# Patient Record
Sex: Female | Born: 1985 | Hispanic: No | Marital: Married | Smoking: Never smoker
Health system: Southern US, Community
[De-identification: ages and names within clinical notes are randomized; demographics above are authoritative.]

## PROBLEM LIST (undated history)

## (undated) ENCOUNTER — Inpatient Hospital Stay (HOSPITAL_COMMUNITY): Payer: Self-pay

## (undated) DIAGNOSIS — Z3480 Encounter for supervision of other normal pregnancy, unspecified trimester: Secondary | ICD-10-CM

## (undated) DIAGNOSIS — Z789 Other specified health status: Secondary | ICD-10-CM

## (undated) HISTORY — DX: Encounter for supervision of other normal pregnancy, unspecified trimester: Z34.80

---

## 2015-12-05 ENCOUNTER — Other Ambulatory Visit (HOSPITAL_COMMUNITY)
Admission: RE | Admit: 2015-12-05 | Discharge: 2015-12-05 | Disposition: A | Discharge status: Home / Self Care | Payer: BLUE CROSS/BLUE SHIELD | Source: Ambulatory Visit | Attending: Obstetrics and Gynecology | Admitting: Obstetrics and Gynecology

## 2015-12-05 DIAGNOSIS — Z1151 Encounter for screening for human papillomavirus (HPV): Secondary | ICD-10-CM | POA: Diagnosis present

## 2015-12-05 DIAGNOSIS — Z01419 Encounter for gynecological examination (general) (routine) without abnormal findings: Secondary | ICD-10-CM | POA: Insufficient documentation

## 2015-12-16 ENCOUNTER — Ambulatory Visit (HOSPITAL_COMMUNITY)
Admission: RE | Admit: 2015-12-16 | Discharge: 2015-12-16 | Disposition: A | Discharge status: Home / Self Care | Payer: BLUE CROSS/BLUE SHIELD | Source: Ambulatory Visit | Attending: Obstetrics and Gynecology | Admitting: Obstetrics and Gynecology

## 2015-12-16 DIAGNOSIS — O352XX0 Maternal care for (suspected) hereditary disease in fetus, not applicable or unspecified: Secondary | ICD-10-CM

## 2015-12-18 ENCOUNTER — Encounter (HOSPITAL_COMMUNITY): Payer: Self-pay

## 2015-12-18 DIAGNOSIS — O352XX0 Maternal care for (suspected) hereditary disease in fetus, not applicable or unspecified: Secondary | ICD-10-CM | POA: Insufficient documentation

## 2015-12-18 HISTORY — DX: Maternal care for (suspected) hereditary disease in fetus, not applicable or unspecified: O35.2XX0

## 2015-12-18 NOTE — Progress Notes (Signed)
Genetic Counseling  High-Risk Gestation Note  Appointment Date:  12/16/2015 Referred By: Thurnell Lose, MD Date of Birth:  11-12-1985 Partner:  Sara Pineda   Pregnancy History: G1P0 Estimated Date of Delivery: 06/28/16 Estimated Gestational Age: 99w1dAttending: MRenella Cunas MD   I met with Mrs. Sara Pineda and her husband, Mr. Sara Pineda for genetic counseling because of Mr. BReznikfamily history of a genetic disorder.  In Summary:   Father of the pregnancy, Mr. 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 BCasstevenshas a 2 in 3 chance to be a carrier. Mrs. BRabelohas general population chance which ranges by subtype but overall less than 1 in 100.   For an autosomal recessive MPS, recurrence risk for pregnancy would be approximately less than 1 in 600, prior to testing for patient or partner  One form of MPS follows X-linked inheritance, which is less likely given the reported family history  In case of X-linked inheritance, pregnancy would not be at increased risk given that Mr. BSuppais apparently unaffected  Genetic testing to determine carrier status may be available; However, given the multiple types of MPS and multiple genes that can be causative, most informative to identify the specific type and if genetic testing has been performed and identified the causative gene change in Mr. BMongillo brother  Carrier screening for various types (but not all) of MPS is available for common mutations in some genes through expanded carrier screening; However, detection rates range from 10%-75%. Thus, a negative expanded carrier screening panel for Mr. BGlabor Mrs. Saran would possibly reduce the chances to be carriers for these conditions but not eliminate the chance.   In the case that both parents are identified to be carriers for an autosomal recessive condition, prenatal diagnosis may be available via amniocentesis. The couple stated that they would not consider pursuing amniocentesis in a pregnancy, even in the case carrier status was identified for them.   Mr. BPrestridgeplanned to inquire with one of his brothers who is directly involved [place] obtain more specific information about the diagnosis and the possible genetic etiology. He will contact [place] additional information is obtained  We began by reviewing the family history in detail. Mr. BMoldenreported two brothers with a genetic disorder, [place] He did not have the exact name of the condition but had a message from an unaffected brother regarding the medical condition that stated, "Mucopolysaccharidose recherche des defauts de l'enzyme." (This translates to "mucopolysaccharidosis, search for the defects in the enzyme.") One affected brother died at age 5711years old, and the other affected brother is currently 261years old and [place] Mr. BRajreported that both individuals had no medical issues at birth. The condition was described to have physical effects but no effect on their intellect or cognitive ability. The older affected brother reportedly has onset around 864years of age, and his bones were described to start  to become deformed. He reportedly died as a result of a nerve problem in his brain; he was described to have woken up  and been unable to walk or talk and then died 2 days later. Mr. Redler had more medical information regarding the surviving affected brother. Medical documentation that Mr. Ahn had access to described him to have deformity of bones in feet at age 74 years, dorsal kyphoscoliosis, upper limb fractures at age 71 years old, and a pelvis fracture at age 6 years causing him to be "bed ridden." He is currently in a wheelchair. He reportedly does not have cognitive effects from the condition.  He was also recently found to have a hole in his heart and have two tumors on his liver. Mr. Brocks reported that his brother has been evaluated by doctors in [place], and a doctor told them that the "DNA was not complete" and it was caused by their parents being related to each other. Mr. Coakley reported that his mother and father are paternal first cousins to each other. Mr. Marsan also reported that there is treatment for his brother's condition in [place] and in the [place] but not in [place]. He is currently treated by a [facility] in [place], but they have not been able to get approval for his travel to [place] for the other treatment available. Mr. Leatherwood also has one additional brother and four sisters who are reportedly unaffected. Three of the sisters have children, ranging in age from 3 years to upper adolescence (two children each, with a total of three males and three females) who are all reportedly healthy.  Mrs. Sara Pineda reported no known relatives with a similar medical issue and no known birth defects, genetic conditions, or intellectual disability in her family history. Mrs. Sara Pineda is also from [place]; the patient and her partner denied consanguinity.   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 We discussed mucopolysaccharidoses in general.   Mucopolysaccharidoses are a group of inherited lysosomal storage diseases that are caused by deficiency of one of several enzymes required for step-wise degradation of glycosaminoglycans (GAGs), also called mucopolysaccharides. The remaining GAGs accumulate in the lysosomes in various cell tissues and lead to cellular dysfunction. MPS disorders are classified into seven different types, with possible subtypes within the classifications. There is variation in which body systems are impacted in the various types with some causing soft tissue and skeletal disease +/- brain disease (MPS I, II, VII), soft tissue and skeletal disease (MPS VI), primarily skeletal disease (MPS IVA, IVB), and primarily central nervous system disorders (MPS III). There is also a broad spectrum of severity of clinical features ranging from mild to severe. In general, infants typically appear normal at birth, with symptoms of the various MPS being progressive as the GAGs accumulate with time. Enzyme replacement therapy is available for some of the various types of MPS conditions.   Mr. Heyden did not have additional information regarding the specific type of MPS, but his report of primarily skeletal involvement without cognitive effects helps narrow the differential diagnosis list. We reviewed that the majority of MPS conditions follow autosomal recessive inheritance, with the exception of Hunter syndrome (MPS II), which follows X-linked inheritance. Given this and the report that his parents are first cousins, autosomal recessive inheritance is most likely for the condition for his brothers. We reviewed genes, chromosomes, and autosomal recessive inheritance. We discussed that  an individual with autosomal recessive inheritance has two nonworking copies of a particular gene. Carriers have one working and one nonworking copy of the gene pair. A pregnancy is at risk to inherit an autosomal recessive condition when both parents are carriers  for the same nonworking gene.   In the case of autosomal recessive inheritance for these relatives, Mr. Arcand would have a 2 in 3 chance to be a carrier, given that he is apparently unaffected. Mrs. Bluett reported no known family history of similar conditions, and she has no known consanguinity to Mr. Rochette. Thus, she would have the general population risk to be a carrier, which ranges by specific type of MPS, but overall would be estimated to be less than 1 in 100 (with some types being much less common than this estimate). Thus, prior to carrier screening for the couple, recurrence risk for the current pregnancy in the case of an autosomal recessive MPS would be approximately less than 1 in 600. The exact general population carrier frequency, and thus exact estimated recurrence risk, varies by MPS type.   We discussed that less likely case of an X-linked recessive condition. Hunter syndrome is an MPS condition that follows X-linked inheritance. In X-linked inheritance, females may carry a nonworking gene change on the X chromosome and have an increased risk to have an affected son. Males with the nonworking gene change would be expected to be affected with the condition. Thus, in the case of an X-linked recessive condition, the pregnancy would not be at increased risk, given that Mr. Breeding is apparently unaffected.   Clinical genetic testing is available for various genes that are causative for MPS. However, each type has different gene(s) that can be causative of the condition. Thus, knowing the specific type of MPS in Mr. Soucy brother (or specific genetic condition, in the case of a different underlying etiology) is needed prior to pursuing genetic testing. Additionally, carrier testing for Mr. Krasinski would be most informative if the familial causative genetic variant(s) has first been identified in his brothers. Carrier screening for some MPS types is available via expanded  carrier screening panels, but these assess for common mutations. Thus, the detection rate ranges from approximately 10% to 75% depending upon the specific MPS type and the specific prevalence. Carrier screening for Mr. or Mrs. Brougham via an expanded carrier screening panel is available, but would not be as informative prior to first identifying the above discussed information. A negative expanded carrier screening panel would decrease but not eliminate the chance to be a carrier for the conditions on the panel, and not all types of MPS are included on these carrier screening panels. Mr. Zito planned to communicate with his brother who is in [place] and directly involved with his affected brother's medical care in order to try to obtain information about the type of MPS and whether or not genetic testing has been performed.   When both parents are identified to be carriers of an autosomal recessive condition, prenatal diagnosis would be available via amniocentesis. We reviewed the risks, benefits, and limitations including the associated risk for complications from amniocentesis. The couple understands that amniocentesis for the genetic condition in the family would not be available in the pregnancy prior to carrier testing/screening for them; However, they stated that they would not pursue amniocentesis even in the case that they were both identified to be carriers for the same genetic condition.    The family histories were otherwise found to be noncontributory for  birth defects, intellectual disability, and known genetic conditions. Without further information regarding the provided family history, an accurate genetic risk cannot be calculated. Further genetic counseling is warranted if more information is obtained.  Mrs. Sara Pineda denied exposure to environmental toxins or chemical agents. She denied the use of alcohol, tobacco or street drugs. She denied significant viral illnesses during the  course of her pregnancy. Her medical and surgical histories were noncontributory.   I counseled this couple regarding the above risks and available options.  The approximate face-to-face time with the genetic counselor was 45 minutes.  Chipper Oman, MS Certified Genetic Counselor 12/18/2015

## 2016-01-02 ENCOUNTER — Other Ambulatory Visit: Payer: Self-pay | Admitting: Obstetrics and Gynecology

## 2016-01-02 DIAGNOSIS — IMO0002 Reserved for concepts with insufficient information to code with codable children: Secondary | ICD-10-CM

## 2016-01-05 ENCOUNTER — Ambulatory Visit (HOSPITAL_COMMUNITY)
Admission: RE | Admit: 2016-01-05 | Discharge: 2016-01-05 | Disposition: A | Discharge status: Home / Self Care | Payer: BLUE CROSS/BLUE SHIELD | Source: Ambulatory Visit | Attending: Obstetrics and Gynecology | Admitting: Obstetrics and Gynecology

## 2016-01-05 ENCOUNTER — Encounter (HOSPITAL_COMMUNITY): Payer: Self-pay | Admitting: *Deleted

## 2016-01-05 DIAGNOSIS — Z3A15 15 weeks gestation of pregnancy: Secondary | ICD-10-CM | POA: Insufficient documentation

## 2016-01-05 DIAGNOSIS — IMO0002 Reserved for concepts with insufficient information to code with codable children: Secondary | ICD-10-CM

## 2016-01-06 ENCOUNTER — Encounter (HOSPITAL_COMMUNITY)
Admission: RE | Disposition: A | Discharge status: Home / Self Care | Payer: Self-pay | Source: Ambulatory Visit | Attending: Obstetrics and Gynecology

## 2016-01-06 ENCOUNTER — Ambulatory Visit (HOSPITAL_COMMUNITY): Payer: BLUE CROSS/BLUE SHIELD | Admitting: Anesthesiology

## 2016-01-06 ENCOUNTER — Ambulatory Visit (HOSPITAL_COMMUNITY)
Admission: RE | Admit: 2016-01-06 | Discharge: 2016-01-06 | Disposition: A | Discharge status: Home / Self Care | Payer: BLUE CROSS/BLUE SHIELD | Source: Ambulatory Visit | Attending: Obstetrics and Gynecology | Admitting: Obstetrics and Gynecology

## 2016-01-06 ENCOUNTER — Ambulatory Visit (HOSPITAL_COMMUNITY): Payer: BLUE CROSS/BLUE SHIELD

## 2016-01-06 ENCOUNTER — Encounter (HOSPITAL_COMMUNITY): Payer: Self-pay | Admitting: Anesthesiology

## 2016-01-06 DIAGNOSIS — Q513 Bicornate uterus: Secondary | ICD-10-CM | POA: Diagnosis not present

## 2016-01-06 DIAGNOSIS — Z3A10 10 weeks gestation of pregnancy: Secondary | ICD-10-CM | POA: Insufficient documentation

## 2016-01-06 DIAGNOSIS — O021 Missed abortion: Secondary | ICD-10-CM | POA: Insufficient documentation

## 2016-01-06 DIAGNOSIS — O029 Abnormal product of conception, unspecified: Secondary | ICD-10-CM

## 2016-01-06 HISTORY — PX: DILATION AND EVACUATION: SHX1459

## 2016-01-06 LAB — CBC
HCT: 35.6 % — ABNORMAL LOW (ref 36.0–46.0)
Hemoglobin: 12.3 g/dL (ref 12.0–15.0)
MCH: 31.1 pg (ref 26.0–34.0)
MCHC: 34.6 g/dL (ref 30.0–36.0)
MCV: 89.9 fL (ref 78.0–100.0)
PLATELETS: 241 10*3/uL (ref 150–400)
RBC: 3.96 MIL/uL (ref 3.87–5.11)
RDW: 12.6 % (ref 11.5–15.5)
WBC: 8.6 10*3/uL (ref 4.0–10.5)

## 2016-01-06 LAB — TYPE AND SCREEN
ABO/RH(D): A POS
ANTIBODY SCREEN: NEGATIVE

## 2016-01-06 LAB — ABO/RH: ABO/RH(D): A POS

## 2016-01-06 SURGERY — DILATION AND EVACUATION, UTERUS
Anesthesia: General | Site: Vagina

## 2016-01-06 MED ORDER — IBUPROFEN 100 MG/5ML PO SUSP: 200.0000 mg | Freq: Four times a day (QID) | ORAL | Status: DC | PRN

## 2016-01-06 MED ORDER — PROPOFOL 10 MG/ML IV BOLUS
INTRAVENOUS | Status: DC | PRN
  Administered 2016-01-06: 50 mg via INTRAVENOUS
  Administered 2016-01-06: 150 mg via INTRAVENOUS

## 2016-01-06 MED ORDER — DEXAMETHASONE SODIUM PHOSPHATE 10 MG/ML IJ SOLN
INTRAMUSCULAR | Status: AC
  Filled 2016-01-06: qty 1

## 2016-01-06 MED ORDER — MISOPROSTOL 100 MCG PO TABS
ORAL_TABLET | ORAL | Status: DC | PRN
  Administered 2016-01-06: 200 ug via RECTAL

## 2016-01-06 MED ORDER — LIDOCAINE HCL (CARDIAC) 20 MG/ML IV SOLN
INTRAVENOUS | Status: AC
  Filled 2016-01-06: qty 5

## 2016-01-06 MED ORDER — ONDANSETRON HCL 4 MG/2ML IJ SOLN
INTRAMUSCULAR | Status: AC
  Filled 2016-01-06: qty 2

## 2016-01-06 MED ORDER — PROPOFOL 10 MG/ML IV BOLUS
INTRAVENOUS | Status: AC
  Filled 2016-01-06: qty 20

## 2016-01-06 MED ORDER — METHYLERGONOVINE MALEATE 0.2 MG PO TABS
0.2000 mg | ORAL_TABLET | Freq: Once | ORAL | Status: AC
  Administered 2016-01-06: 0.2 mg via ORAL

## 2016-01-06 MED ORDER — SCOPOLAMINE 1 MG/3DAYS TD PT72
1.0000 | MEDICATED_PATCH | Freq: Once | TRANSDERMAL | Status: DC
  Administered 2016-01-06: 1.5 mg via TRANSDERMAL

## 2016-01-06 MED ORDER — SUCCINYLCHOLINE CHLORIDE 20 MG/ML IJ SOLN
INTRAMUSCULAR | Status: AC
  Filled 2016-01-06: qty 1

## 2016-01-06 MED ORDER — GLYCOPYRROLATE 0.2 MG/ML IJ SOLN
INTRAMUSCULAR | Status: DC | PRN
  Administered 2016-01-06: 0.2 mg via INTRAVENOUS

## 2016-01-06 MED ORDER — HYDROCODONE-ACETAMINOPHEN 7.5-325 MG PO TABS: 1.0000 | ORAL_TABLET | Freq: Once | ORAL | Status: DC | PRN

## 2016-01-06 MED ORDER — MEPERIDINE HCL 25 MG/ML IJ SOLN: 6.2500 mg | INTRAMUSCULAR | Status: DC | PRN

## 2016-01-06 MED ORDER — METHYLERGONOVINE MALEATE 0.2 MG PO TABS: 0.2000 mg | ORAL_TABLET | Freq: Four times a day (QID) | ORAL | Status: DC

## 2016-01-06 MED ORDER — FENTANYL CITRATE (PF) 100 MCG/2ML IJ SOLN
INTRAMUSCULAR | Status: DC | PRN
  Administered 2016-01-06: 50 ug via INTRAVENOUS
  Administered 2016-01-06: 100 ug via INTRAVENOUS

## 2016-01-06 MED ORDER — MIDAZOLAM HCL 2 MG/2ML IJ SOLN
INTRAMUSCULAR | Status: DC | PRN
  Administered 2016-01-06: 2 mg via INTRAVENOUS

## 2016-01-06 MED ORDER — IBUPROFEN 600 MG PO TABS: 600.0000 mg | ORAL_TABLET | Freq: Four times a day (QID) | ORAL | Status: DC | PRN

## 2016-01-06 MED ORDER — DEXAMETHASONE SODIUM PHOSPHATE 4 MG/ML IJ SOLN
INTRAMUSCULAR | Status: DC | PRN
  Administered 2016-01-06: 4 mg via INTRAVENOUS

## 2016-01-06 MED ORDER — ONDANSETRON HCL 4 MG/2ML IJ SOLN: 4.0000 mg | Freq: Once | INTRAMUSCULAR | Status: DC | PRN

## 2016-01-06 MED ORDER — KETOROLAC TROMETHAMINE 30 MG/ML IJ SOLN
INTRAMUSCULAR | Status: DC | PRN
  Administered 2016-01-06: 30 mg via INTRAVENOUS

## 2016-01-06 MED ORDER — FENTANYL CITRATE (PF) 100 MCG/2ML IJ SOLN: 25.0000 ug | INTRAMUSCULAR | Status: DC | PRN

## 2016-01-06 MED ORDER — KETOROLAC TROMETHAMINE 30 MG/ML IJ SOLN: 30.0000 mg | Freq: Once | INTRAMUSCULAR | Status: DC

## 2016-01-06 MED ORDER — NEOSTIGMINE METHYLSULFATE 10 MG/10ML IV SOLN
INTRAVENOUS | Status: AC
  Filled 2016-01-06: qty 1

## 2016-01-06 MED ORDER — IBUPROFEN 200 MG PO TABS: 200.0000 mg | ORAL_TABLET | Freq: Four times a day (QID) | ORAL | Status: DC | PRN

## 2016-01-06 MED ORDER — CEFAZOLIN SODIUM-DEXTROSE 2-3 GM-% IV SOLR
INTRAVENOUS | Status: AC
Start: 2016-01-06 — End: 2016-01-06
  Administered 2016-01-06: 2 g via INTRAVENOUS
  Filled 2016-01-06: qty 50

## 2016-01-06 MED ORDER — ONDANSETRON HCL 4 MG/2ML IJ SOLN
INTRAMUSCULAR | Status: DC | PRN
  Administered 2016-01-06: 4 mg via INTRAVENOUS

## 2016-01-06 MED ORDER — LIDOCAINE HCL 2 % IJ SOLN
INTRAMUSCULAR | Status: DC | PRN
  Administered 2016-01-06: 7 mL

## 2016-01-06 MED ORDER — CEFAZOLIN SODIUM-DEXTROSE 2-4 GM/100ML-% IV SOLN
2.0000 g | INTRAVENOUS | Status: DC
  Filled 2016-01-06: qty 100

## 2016-01-06 MED ORDER — METHYLERGONOVINE MALEATE 0.2 MG PO TABS
ORAL_TABLET | ORAL | Status: AC
  Administered 2016-01-06: 0.2 mg via ORAL
  Filled 2016-01-06: qty 1

## 2016-01-06 MED ORDER — LIDOCAINE HCL 2 % IJ SOLN
INTRAMUSCULAR | Status: AC
  Filled 2016-01-06: qty 20

## 2016-01-06 MED ORDER — LACTATED RINGERS IV SOLN
INTRAVENOUS | Status: DC
Start: 2016-01-06 — End: 2016-01-06
  Administered 2016-01-06 (×2): 1000 mL via INTRAVENOUS

## 2016-01-06 MED ORDER — SCOPOLAMINE 1 MG/3DAYS TD PT72
MEDICATED_PATCH | TRANSDERMAL | Status: AC
  Filled 2016-01-06: qty 1

## 2016-01-06 MED ORDER — LIDOCAINE HCL (CARDIAC) 20 MG/ML IV SOLN
INTRAVENOUS | Status: DC | PRN
  Administered 2016-01-06: 50 mg via INTRAVENOUS

## 2016-01-06 MED ORDER — SUCCINYLCHOLINE CHLORIDE 20 MG/ML IJ SOLN
INTRAMUSCULAR | Status: DC | PRN
  Administered 2016-01-06: 120 mg via INTRAVENOUS

## 2016-01-06 MED ORDER — ROCURONIUM BROMIDE 100 MG/10ML IV SOLN
INTRAVENOUS | Status: AC
  Filled 2016-01-06: qty 1

## 2016-01-06 MED ORDER — FENTANYL CITRATE (PF) 250 MCG/5ML IJ SOLN
INTRAMUSCULAR | Status: AC
Start: 2016-01-06 — End: 2016-01-06
  Filled 2016-01-06: qty 5

## 2016-01-06 MED ORDER — MIDAZOLAM HCL 2 MG/2ML IJ SOLN
INTRAMUSCULAR | Status: AC
  Filled 2016-01-06: qty 2

## 2016-01-06 MED ORDER — MISOPROSTOL 200 MCG PO TABS
ORAL_TABLET | ORAL | Status: AC
  Filled 2016-01-06: qty 1

## 2016-01-06 SURGICAL SUPPLY — 18 items
CATH ROBINSON RED A/P 16FR (CATHETERS) ×3 IMPLANT
CLOTH BEACON ORANGE TIMEOUT ST (SAFETY) ×3 IMPLANT
DECANTER SPIKE VIAL GLASS SM (MISCELLANEOUS) ×3 IMPLANT
GLOVE BIO SURGEON STRL SZ7 (GLOVE) ×6 IMPLANT
GLOVE BIOGEL PI IND STRL 7.0 (GLOVE) ×1 IMPLANT
GLOVE BIOGEL PI INDICATOR 7.0 (GLOVE) ×2
GOWN STRL REUS W/TWL LRG LVL3 (GOWN DISPOSABLE) ×6 IMPLANT
KIT BERKELEY 1ST TRIMESTER 3/8 (MISCELLANEOUS) ×3 IMPLANT
NS IRRIG 1000ML POUR BTL (IV SOLUTION) ×3 IMPLANT
PACK VAGINAL MINOR WOMEN LF (CUSTOM PROCEDURE TRAY) ×3 IMPLANT
PAD OB MATERNITY 4.3X12.25 (PERSONAL CARE ITEMS) ×3 IMPLANT
PAD PREP 24X48 CUFFED NSTRL (MISCELLANEOUS) ×3 IMPLANT
SET BERKELEY SUCTION TUBING (SUCTIONS) ×3 IMPLANT
TOWEL OR 17X24 6PK STRL BLUE (TOWEL DISPOSABLE) ×6 IMPLANT
VACURETTE 10 RIGID CVD (CANNULA) ×3 IMPLANT
VACURETTE 7MM CVD STRL WRAP (CANNULA) IMPLANT
VACURETTE 8 RIGID CVD (CANNULA) IMPLANT
VACURETTE 9 RIGID CVD (CANNULA) IMPLANT

## 2016-01-06 NOTE — Interval H&P Note (Signed)
History and Physical Interval Note:  01/06/2016 2:30 PM  Sara Pineda  has presented today for surgery, with the diagnosis of  Missed Abortion Biconuate Uterus  The various methods of treatment have been discussed with the patient and family. After consideration of risks, benefits and other options for treatment, the patient has consented to  Procedure(s): DILATATION AND EVACUATION (N/A) as a surgical intervention .  The patient's history has been reviewed, patient examined, no change in status, stable for surgery.  I have reviewed the patient's chart and labs.  Questions were answered to the patient's satisfaction.     Simona Huh, Sara Pineda

## 2016-01-06 NOTE — Anesthesia Preprocedure Evaluation (Signed)
Anesthesia Evaluation  Patient identified by MRN, date of birth, ID band Patient awake    Reviewed: Allergy & Precautions, H&P , NPO status , Patient's Chart, lab work & pertinent test results  Airway Mallampati: I  TM Distance: <3 FB Neck ROM: full    Dental no notable dental hx. (+) Teeth Intact   Pulmonary neg pulmonary ROS,    Pulmonary exam normal        Cardiovascular negative cardio ROS Normal cardiovascular exam     Neuro/Psych negative neurological ROS  negative psych ROS   GI/Hepatic negative GI ROS, Neg liver ROS,   Endo/Other  negative endocrine ROS  Renal/GU negative Renal ROS  negative genitourinary   Musculoskeletal negative musculoskeletal ROS (+)   Abdominal Normal abdominal exam  (+)   Peds  Hematology negative hematology ROS (+)   Anesthesia Other Findings   Reproductive/Obstetrics negative OB ROS                             Anesthesia Physical Anesthesia Plan  ASA: II  Anesthesia Plan: General   Post-op Pain Management:    Induction: Intravenous  Airway Management Planned: Oral ETT  Additional Equipment:   Intra-op Plan:   Post-operative Plan: Extubation in OR  Informed Consent: I have reviewed the patients History and Physical, chart, labs and discussed the procedure including the risks, benefits and alternatives for the proposed anesthesia with the patient or authorized representative who has indicated his/her understanding and acceptance.   Dental Advisory Given  Plan Discussed with: CRNA, Surgeon and Anesthesiologist  Anesthesia Plan Comments:         Anesthesia Quick Evaluation

## 2016-01-06 NOTE — Discharge Instructions (Addendum)
Dilation and Curettage or Vacuum Curettage, Care After Refer to this sheet in the next few weeks. These instructions provide you with information on caring for yourself after your procedure. Your health care provider may also give you more specific instructions. Your treatment has been planned according to current medical practices, but problems sometimes occur. Call your health care provider if you have any problems or questions after your procedure. WHAT TO EXPECT AFTER THE PROCEDURE After your procedure, it is typical to have light cramping and bleeding. This may last for 2 days to 2 weeks after the procedure. HOME CARE INSTRUCTIONS   Do not drive for 24 hours.  Wait 1 week before returning to strenuous activities.  Take your temperature 2 times a day for 4 days and write it down. Provide these temperatures to your health care provider if you develop a fever.  Avoid long periods of standing.  Avoid heavy lifting, pushing, or pulling. Do not lift anything heavier than 10 pounds (4.5 kg).  Limit stair climbing to once or twice a day.  Take rest periods often.  You may resume your usual diet.  Drink enough fluids to keep your urine clear or pale yellow.  Your usual bowel function should return. If you have constipation, you may:  Take a mild laxative with permission from your health care provider.  Add fruit and bran to your diet.  Drink more fluids.  Take showers instead of baths until your health care provider gives you permission to take baths.  Do not go swimming or use a hot tub until your health care provider approves.  Try to have someone with you or available to you the first 24-48 hours, especially if you were given a general anesthetic.  Do not douche, use tampons, or have sex (intercourse) for 2 weeks after the procedure.  Only take over-the-counter or prescription medicines as directed by your health care provider. Do not take aspirin. It can cause  bleeding.  Follow up with your health care provider as directed. SEEK MEDICAL CARE IF:   You have increasing cramps or pain that is not relieved with medicine.  You have abdominal pain that does not seem to be related to the same area of earlier cramping and pain.  You have bad smelling vaginal discharge.  You have a rash.  You are having problems with any medicine. SEEK IMMEDIATE MEDICAL CARE IF:   You have bleeding that is heavier than a normal menstrual period.  You have a fever.  You have chest pain.  You have shortness of breath.  You feel dizzy or feel like fainting.  You pass out.  You have pain in your shoulder strap area.  You have heavy vaginal bleeding with or without blood clots. MAKE SURE YOU:   Understand these instructions.  Will watch your condition.  Will get help right away if you are not doing well or get worse.   This information is not intended to replace advice given to you by your health care provider. Make sure you discuss any questions you have with your health care provider.   Document Released: 09/24/2000 Document Revised: 10/02/2013 Document Reviewed: 04/26/2013 Elsevier Interactive Patient Education 2016 [place] Instructions  NO IBUPROFEN PRODUCTS UNTIL  9:45 PM TONIGHT  Activity: Get plenty of rest for the remainder of the day. A responsible adult should stay with you for 24 hours following the procedure.  For the next 24 hours, DO NOT: -Drive a car -  Operate machinery -Drink alcoholic beverages -Take any medication unless instructed by your physician -Make any legal decisions or sign important papers.  Meals: Start with liquid foods such as gelatin or soup. Progress to regular foods as tolerated. Avoid greasy, spicy, heavy foods. If nausea and/or vomiting occur, drink only clear liquids until the nausea and/or vomiting subsides. Call your physician if vomiting continues.  Special  Instructions/Symptoms: Your throat may feel dry or sore from the anesthesia or the breathing tube placed in your throat during surgery. If this causes discomfort, gargle with warm salt water. The discomfort should disappear within 24 hours.  If you had a scopolamine patch placed behind your ear for the management of post- operative nausea and/or vomiting:  1. The medication in the patch is effective for 72 hours, after which it should be removed.  Wrap patch in a tissue and discard in the trash. Wash hands thoroughly with soap and water. 2. You may remove the patch earlier than 72 hours if you experience unpleasant side effects which may include dry mouth, dizziness or visual disturbances. 3. Avoid touching the patch. Wash your hands with soap and water after contact with the patch.

## 2016-01-06 NOTE — H&P (Signed)
  History of Present Illness  General:  30 y/o G1 @ 15 1/7 weeks by LMP. Missed abortion, Measuring ~ 10 5/7 weeks. Presents for f/u after missed abortion diagnosed 3 days ago. Ultrasound done at hospital confirmed a nonviable fetus, above GA. Also a bicornuate uterus is suspected based on ultrasound images. Pregnancy is predominately in the right horn. Ultrasound d/w Dr. Renella Cunas, MFM. Pt had light spotting ~ 1 week ago but now has no bleeding or cramping. Pt has been very emotional. She would like to proceed with D&E ASAP.   Current Medications  None   Past Medical History  Pregnancy: yes.           Surgical History  Denies Past Surgical History   Family History  Father: alive, A + W  Mother: alive, A + W  denies any GYN family cancer hx.   Social History  General:  Tobacco use  cigarettes: Never smoked Tobacco history last updated 01/05/2016 no EXPOSURE TO PASSIVE SMOKE.  Caffeine: yes.  no Recreational drug use.  no Exercise.  Marital Status: married.  Children: none.    Gyn History  Sexual activity currently sexually active.  Periods : every month.  LMP 09/22/15.  Denies H/O Birth control.  Denies H/O Last pap smear date.  Denies H/O Last mammogram date.  Denies H/O Abnormal pap smear.  Denies H/O STD.    OB History  Number of pregnancies 1.  Pregnancy # 1 now.    Allergies  N.K.D.A.   Hospitalization/Major Diagnostic Procedure  Denies Past Hospitalization   Review of Systems  Denies fever/chills, chest pain, SOB, headaches, numbness/tingling. No h/o complication with anesthesia, bleeding disorders or blood clots.   Vital Signs  BP sitting 92/60.   Physical Examination  GENERAL:  Patient appears alert and oriented.  General Appearance: well-appearing, well-developed, no acute distress.  Speech: clear.  LUNGS:  Auscultation: no wheezing/rhonchi/rales. CTA bilaterally.  HEART:  Heart sounds: normal. RRR. no murmur.  ABDOMEN:  General:  soft nontender, nondistended, no masses.  FEMALE GENITOURINARY:  Pelvic Not examined. Cervix previously closed, no blood in vault, uterus small, nontender.  EXTREMITIES:  General: No edema or calf tenderness.     Assessments   1. Missed abortion - O02.1 (Primary)   2. 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 Missed abortion  Notes: Recommend proceeding with D&E due to increased risk of bleeding. Recommend it be performed under ultrasound guidance to ensure no injury to the uterus. Risk of bleeding, infection discussed.  Referral OE:[id number] [facility] - Gynecology Reason:Precert and Schedule Suction D&E under ultrasound guidanceSchedule 01/06/16 afternoon (2:15 or 2:30 per OR)    2. Bicornuate uterus  Notes: HSG postpartum.  Referral OE:[id number] [facility] - Gynecology Reason:Precert and Schedule Suction D&E under ultrasound guidanceSchedule 01/06/16 afternoon (2:15 or 2:30 per OR)    Procedure Codes  [id number] POSTOP F U VISIT   Follow Up  2 Weeks post op

## 2016-01-06 NOTE — Brief Op Note (Signed)
01/06/2016  3:55 PM  PATIENT:  Erikka Wilhoite  30 y.o. female  PRE-OPERATIVE DIAGNOSIS:   Missed Abortion @ 10 5/7 weeks, Biconuate Uterus  POST-OPERATIVE DIAGNOSIS:  Missed Abortion Biconuate Uterus  PROCEDURE:  Procedure(s): DILATATION AND EVACUATION with Ultrasound Guidance (N/A)  SURGEON:  Surgeon(s) and Role:    * Thurnell Lose, MD - Primary  PHYSICIAN ASSISTANT:   ASSISTANTS: none   ANESTHESIA:   general  EBL:  Total I/O In: 1000 [I.V.:1000] Out: 500 [Urine:400; Blood:100]  BLOOD ADMINISTERED:none  DRAINS: none   LOCAL MEDICATIONS USED:  2 % XYLOCAINE, less than 10 mL  SPECIMEN:  Source of Specimen:  POCs, Specimen sent for genetic testing  DISPOSITION OF SPECIMEN:  PATHOLOGY  COUNTS:  YES  TOURNIQUET:  * No tourniquets in log *  DICTATION: .Other Dictation: Dictation Number [id number]  PLAN OF CARE: Discharge to home after PACU  PATIENT DISPOSITION:  PACU - hemodynamically stable.   Delay start of Pharmacological VTE agent (>24hrs) due to surgical blood loss or risk of bleeding: yes

## 2016-01-06 NOTE — Interval H&P Note (Deleted)
History and Physical Interval Note:  01/06/2016 2:30 PM  Sara Pineda  has presented today for surgery, with the diagnosis of  Missed Abortion Biconuate Uterus  The various methods of treatment have been discussed with the patient and family. After consideration of risks, benefits and other options for treatment, the patient has consented to  Procedure(s): DILATATION AND EVACUATION (N/A) as a surgical intervention .  The patient's history has been reviewed, patient examined, no change in status, stable for surgery.  I have reviewed the patient's chart and labs.  Questions were answered to the patient's satisfaction.     Simona Huh, Jory Tanguma

## 2016-01-06 NOTE — Anesthesia Procedure Notes (Signed)
Procedure Name: Intubation Date/Time: 01/06/2016 3:16 PM Performed by: Jonna Munro Pre-anesthesia Checklist: Patient identified, Emergency Drugs available, Suction available, Timeout performed and Patient being monitored Patient Re-evaluated:Patient Re-evaluated prior to inductionOxygen Delivery Method: Circle system utilized Preoxygenation: Pre-oxygenation with 100% oxygen Intubation Type: IV induction, Rapid sequence and Cricoid Pressure applied Grade View: Grade I Tube type: Oral Tube size: 7.0 mm Number of attempts: 1 Airway Equipment and Method: Stylet Placement Confirmation: ETT inserted through vocal cords under direct vision,  positive ETCO2 and breath sounds checked- equal and bilateral Secured at: 20 cm Tube secured with: Tape Dental Injury: Teeth and Oropharynx as per pre-operative assessment

## 2016-01-06 NOTE — Transfer of Care (Signed)
Immediate Anesthesia Transfer of Care Note  Patient: Sara Pineda  Procedure(s) Performed: Procedure(s): DILATATION AND EVACUATION with Ultrasound Guidance (N/A)  Patient Location: PACU  Anesthesia Type:General  Level of Consciousness: awake, alert  and oriented  Airway & Oxygen Therapy: Patient Spontanous Breathing and Patient connected to nasal cannula oxygen  Post-op Assessment: Report given to RN and Post -op Vital signs reviewed and stable  Post vital signs: Reviewed and stable  Last Vitals:  Filed Vitals:   01/06/16 1420  BP: 113/78  Pulse: 90  Temp: 37.2 C  Resp: 16    Complications: No apparent anesthesia complications

## 2016-01-07 ENCOUNTER — Encounter (HOSPITAL_COMMUNITY): Payer: Self-pay | Admitting: Obstetrics and Gynecology

## 2016-01-07 NOTE — Anesthesia Postprocedure Evaluation (Signed)
Anesthesia Post Note  Patient: Sara Pineda  Procedure(s) Performed: Procedure(s) (LRB): DILATATION AND EVACUATION with Ultrasound Guidance (N/A)  Patient location during evaluation: PACU Anesthesia Type: General Level of consciousness: awake and alert Pain management: pain level controlled Vital Signs Assessment: post-procedure vital signs reviewed and stable Respiratory status: spontaneous breathing, nonlabored ventilation, respiratory function stable and patient connected to nasal cannula oxygen Cardiovascular status: blood pressure returned to baseline and stable Postop Assessment: no signs of nausea or vomiting Anesthetic complications: no    Last Vitals:  Filed Vitals:   01/06/16 1715 01/06/16 1900  BP: 104/64 104/70  Pulse: 82 80  Temp:  36.9 C  Resp: 21 16    Last Pain:  Filed Vitals:   01/06/16 1952  PainSc: 0-No pain                 Montez Hageman

## 2016-01-07 NOTE — Op Note (Signed)
[name], [name] NO.:  [id number]  MEDICAL RECORD NO.:  [id number]  LOCATION:  [facility]                          FACILITY:  [facility]  PHYSICIAN:  Jola Schmidt, MD   DATE OF BIRTH:  [birth year]  DATE OF PROCEDURE:  01/06/2016 DATE OF DISCHARGE:  01/06/2016                              OPERATIVE REPORT   PREOPERATIVE DIAGNOSES: 1. Missed abortion at 10 weeks and 5/7th days. 2. Bicornuate uterus.  POSTOPERATIVE DIAGNOSES: 1. Missed abortion at 10 weeks and 5/7th days. 2. Bicornuate uterus.  PROCEDURE:  Dilation and evacuation under ultrasound guidance.  SURGEON:  Jola Schmidt, M.D.  ASSISTANT:  None.  ANESTHESIA:  General.  ESTIMATED BLOOD LOSS:  100.  URINE OUTPUT:  400.  BLOOD ADMINISTERED:  None.  DRAINS:  None.  LOCAL:  Xylocaine 2% less than 10 mL.  SPECIMEN:  Products of conception and were also sent for genetic testing.  Disposition to pathology and genetics.  PATIENT DISPOSITION:  To PACU hemodynamically stable.  COMPLICATIONS:  None.  FINDINGS:  Pregnancy, dominant horn, rudimentary horn with thickening. Normal cervix and vagina.  INDICATIONS:  Sara Pineda is a 30 year old, gravida 1, who presented for routine OB visit at approximately 14-5/7th weeks.  Fetal heart tones were not heard via Doppler.  Ultrasound confirmed that there was no heartbeat.  Formal ultrasound was performed which confirmed fetal demise, but also bicornuate uterus.  The patient was counseled on D and E under ultrasound guidance.  The most of the placenta appeared to be in the right hand side.  DESCRIPTION OF PROCEDURE:  The patient was taken to the operating room after being identified in the holding area.  She had IV running.  She was then taken to OR #3.  She was placed in dorsal supine position and then placed in dorsal lithotomy position.  Underwent general endotracheal anesthesia without complication.  She was prepped and draped in normal sterile  fashion.  A time-out was performed.  SCDs were on and operating.  Ancef 2 g IV had been administered prior to the procedure.  A Graves speculum was inserted into the vagina.  Cervix was visualized. Anterior lip of the cervix was injected with Xylocaine.  Single-tooth tenaculum was then applied.  Prior to putting the Graves speculum, I did a bimanual which revealed an anteverted uterus.  I then dilated up to a 10 Hegar under ultrasound guidance.  Suction curette of 10 mm was used and that was used to suction out the content.  Large portion of the placenta was left to be removed and then the fetal parts and the head were compressed and then I was able to completely evacuate the uterus. I did a sharp curettage of the uterus under ultrasound guidance and I suctioned until no tissue was obtained.  I then went back and tried to suction out the smaller rudimentary horn and then I got about 50% of the tissue out of that area.  It was not consistent with placenta.  There was some copious bleeding at the end of the procedure.  Cytotec 200 per rectum was placed, pressure was held.  Fundal massage was performed and it was hemostatic within less than a  minute.  The tissue was collected for genetics by the technician.  All instruments were removed from the vagina.  Again, cervix was hemostatic.  There was no active bleeding noted.  All instruments and sponge counts were correct x3.  The patient was taken to the recovery room in stable condition.  She tolerated the procedure well.     Jola Schmidt, MD     EBV/MEDQ  D:  01/06/2016  T:  01/07/2016  Job:  [id number]

## 2016-02-05 LAB — CHROMOSOME STD, POC(TISSUE)-NCBH

## 2016-09-29 ENCOUNTER — Other Ambulatory Visit: Payer: Self-pay | Admitting: Obstetrics and Gynecology

## 2016-09-29 DIAGNOSIS — N979 Female infertility, unspecified: Secondary | ICD-10-CM

## 2016-10-05 ENCOUNTER — Encounter (INDEPENDENT_AMBULATORY_CARE_PROVIDER_SITE_OTHER): Payer: Self-pay

## 2016-10-05 ENCOUNTER — Ambulatory Visit (HOSPITAL_COMMUNITY)
Admission: RE | Admit: 2016-10-05 | Discharge: 2016-10-05 | Disposition: A | Discharge status: Home / Self Care | Payer: BLUE CROSS/BLUE SHIELD | Source: Ambulatory Visit | Attending: Obstetrics and Gynecology | Admitting: Obstetrics and Gynecology

## 2016-10-05 DIAGNOSIS — Q519 Congenital malformation of uterus and cervix, unspecified: Secondary | ICD-10-CM | POA: Diagnosis not present

## 2016-10-05 DIAGNOSIS — N979 Female infertility, unspecified: Secondary | ICD-10-CM | POA: Insufficient documentation

## 2016-10-05 MED ORDER — IOPAMIDOL (ISOVUE-300) INJECTION 61%
30.0000 mL | Freq: Once | INTRAVENOUS | Status: AC | PRN
  Administered 2016-10-05: 13 mL

## 2016-10-20 ENCOUNTER — Encounter (HOSPITAL_COMMUNITY): Payer: Self-pay

## 2017-12-09 ENCOUNTER — Encounter: Payer: Self-pay | Admitting: *Deleted

## 2018-01-10 ENCOUNTER — Ambulatory Visit (INDEPENDENT_AMBULATORY_CARE_PROVIDER_SITE_OTHER): Payer: BLUE CROSS/BLUE SHIELD | Admitting: Medical

## 2018-01-10 ENCOUNTER — Encounter: Payer: Self-pay | Admitting: Medical

## 2018-01-10 ENCOUNTER — Encounter: Payer: Self-pay | Admitting: *Deleted

## 2018-01-10 ENCOUNTER — Other Ambulatory Visit: Payer: Self-pay

## 2018-01-10 DIAGNOSIS — O3401 Maternal care for unspecified congenital malformation of uterus, first trimester: Secondary | ICD-10-CM

## 2018-01-10 DIAGNOSIS — Q512 Other doubling of uterus, unspecified: Secondary | ICD-10-CM

## 2018-01-10 DIAGNOSIS — Q5128 Other doubling of uterus, other specified: Secondary | ICD-10-CM

## 2018-01-10 DIAGNOSIS — Z3480 Encounter for supervision of other normal pregnancy, unspecified trimester: Secondary | ICD-10-CM | POA: Insufficient documentation

## 2018-01-10 DIAGNOSIS — Z3481 Encounter for supervision of other normal pregnancy, first trimester: Secondary | ICD-10-CM

## 2018-01-10 HISTORY — DX: Other and unspecified doubling of uterus: Q51.28

## 2018-01-10 LAB — POCT URINALYSIS DIP (DEVICE)
BILIRUBIN URINE: NEGATIVE
Glucose, UA: NEGATIVE mg/dL
Ketones, ur: NEGATIVE mg/dL
NITRITE: NEGATIVE
PH: 6 (ref 5.0–8.0)
PROTEIN: NEGATIVE mg/dL
Specific Gravity, Urine: 1.025 (ref 1.005–1.030)
Urobilinogen, UA: 0.2 mg/dL (ref 0.0–1.0)

## 2018-01-10 NOTE — Progress Notes (Signed)
   PRENATAL VISIT NOTE  Subjective:  Sara Pineda is a 32 y.o. G2P0010 at [redacted]w[redacted]d being seen today for first prenatal visit.  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 (per patient - from Dr. Kerin Perna) Patient saw Dr. Kerin Perna for almost 1 year after her miscarriage. Had failed IUI attempt and used Femara and Ovidrel for multiple cycles without success. Decided to take a break and continue to try naturally and conceived on a natural cycle with this pregnancy.   Patient reports no complaints.  Contractions: Not present. Vag. Bleeding: None.  Movement: Absent. Denies leaking of fluid.   The following portions of the patient's history were reviewed and updated as appropriate: allergies, current medications, past family history, past medical history, past social history, past surgical history and problem list. Problem list updated.  Objective:   Vitals:   01/10/18 0858  BP: 128/78  Pulse: 78  Weight: 128 lb 6.4 oz (58.2 kg)    Fetal Status: Fetal Heart Rate (bpm): 165   Movement: Absent     General:  Alert, oriented and cooperative. Patient is in no acute distress.  Skin: Skin is warm and dry. No rash noted.   Cardiovascular: Normal heart rate and rhythm noted  Respiratory: Normal respiratory effort, no problems with respiration noted. Clear to auscultation  Abdomen: Soft, gravid, appropriate for gestational age. Normal bowel sounds. Pain/Pressure: Absent     Pelvic: Cervical exam performed      Scant white discharge. Cervix closed, thick, firm. Uterus appropriate size for GA.  Breast: symmetric, no masses or tenderness. No nipple discharge noted.   Extremities: Normal range of motion.     Mental Status: Normal mood and affect. Normal  behavior. Normal judgment and thought content.   Assessment and Plan:  Pregnancy: G2P0010 at [redacted]w[redacted]d  1. Supervision of other normal pregnancy, antepartum - Enroll patient in Babyscripts Program - Culture, OB Urine - Cystic fibrosis gene test - Hemoglobinopathy Evaluation - Obstetric Panel, Including HIV - Genetic Screening  2. 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 Kerin Perna mentioned this at time of first miscarriage.  - Will re-evaluate at anatomy US   Preterm labor/ first trimester warning symptoms and general obstetric precautions including but not limited to vaginal bleeding, contractions, leaking of fluid and fetal movement were reviewed in detail with the patient. Please refer to After Visit Summary for other counseling recommendations.  Return in about 1 month (around 02/07/2018) for [facility].  Future Appointments  Date Time Provider [place]  02/08/2018  8:15 AM Sloan Leiter, MD [facility]    Kerry Hough, PA-C

## 2018-01-10 NOTE — Patient Instructions (Signed)
Prenatal Care WHAT IS PRENATAL CARE? Prenatal care is the process of caring for a pregnant woman before she gives birth. Prenatal care makes sure that she and her baby remain as healthy as possible throughout pregnancy. Prenatal care may be provided by a midwife, family practice health care provider, or a childbirth and pregnancy specialist (obstetrician). Prenatal care may include physical examinations, testing, treatments, and education on nutrition, lifestyle, and social support services. WHY IS PRENATAL CARE SO IMPORTANT? Early and consistent prenatal care increases the chance that you and your baby will remain healthy throughout your pregnancy. This type of care also decreases a baby's risk of being born too early (prematurely), or being born smaller than expected (small for gestational age). Any underlying medical conditions you may have that could pose a risk during your pregnancy are discussed during prenatal care visits. You will also be monitored regularly for any new conditions that may arise during your pregnancy so they can be treated quickly and effectively. WHAT HAPPENS DURING PRENATAL CARE VISITS? Prenatal care visits may include the following: Discussion Tell your health care provider about any new signs or symptoms you have experienced since your last visit. These might include:  Nausea or vomiting.  Increased or decreased level of energy.  Difficulty sleeping.  Back or leg pain.  Weight changes.  Frequent urination.  Shortness of breath with physical activity.  Changes in your skin, such as the development of a rash or itchiness.  Vaginal discharge or bleeding.  Feelings of excitement or nervousness.  Changes in your baby's movements.  You may want to write down any questions or topics you want to discuss with your health care provider and bring them with you to your appointment. Examination During your first prenatal care visit, you will likely have a complete  physical exam. Your health care provider will often examine your vagina, cervix, and the position of your uterus, as well as check your heart, lungs, and other body systems. As your pregnancy progresses, your health care provider will measure the size of your uterus and your baby's position inside your uterus. He or she may also examine you for early signs of labor. Your prenatal visits may also include checking your blood pressure and, after about 10-12 weeks of pregnancy, listening to your baby's heartbeat. Testing Regular testing often includes:  Urinalysis. This checks your urine for glucose, protein, or signs of infection.  Blood count. This checks the levels of white and red blood cells in your body.  Tests for sexually transmitted infections (STIs). Testing for STIs at the beginning of pregnancy is routinely done and is required in many states.  Antibody testing. You will be checked to see if you are immune to certain illnesses, such as rubella, that can affect a developing fetus.  Glucose screen. Around 24-28 weeks of pregnancy, your blood glucose level will be checked for signs of gestational diabetes. Follow-up tests may be recommended.  Group B strep. This is a bacteria that is commonly found inside a woman's vagina. This test will inform your health care provider if you need an antibiotic to reduce the amount of this bacteria in your body prior to labor and childbirth.  Ultrasound. Many pregnant women undergo an ultrasound screening around 18-20 weeks of pregnancy to evaluate the health of the fetus and check for any developmental abnormalities.  HIV (human immunodeficiency virus) testing. Early in your pregnancy, you will be screened for HIV. If you are at high risk for HIV, this test may   be repeated during your third trimester of pregnancy.  You may be offered other testing based on your age, personal or family medical history, or other factors. HOW OFTEN SHOULD I PLAN TO SEE MY  HEALTH CARE PROVIDER FOR PRENATAL CARE? Your prenatal care check-up schedule depends on any medical conditions you have before, or develop during, your pregnancy. If you do not have any underlying medical conditions, you will likely be seen for checkups:  Monthly, during the first 6 months of pregnancy.  Twice a month during months 7 and 8 of pregnancy.  Weekly starting in the 9th month of pregnancy and until delivery.  If you develop signs of early labor or other concerning signs or symptoms, you may need to see your health care provider more often. Ask your health care provider what prenatal care schedule is best for you. WHAT CAN I DO TO KEEP MYSELF AND MY BABY AS HEALTHY AS POSSIBLE DURING MY PREGNANCY?  Take a prenatal vitamin containing 400 micrograms (0.4 mg) of folic acid every day. Your health care provider may also ask you to take additional vitamins such as iodine, vitamin D, iron, copper, and zinc.  Take 1500-2000 mg of calcium daily starting at your 20th week of pregnancy until you deliver your baby.  Make sure you are up to date on your vaccinations. Unless directed otherwise by your health care provider: ? You should receive a tetanus, diphtheria, and pertussis (Tdap) vaccination between the 27th and 36th week of your pregnancy, regardless of when your last Tdap immunization occurred. This helps protect your baby from whooping cough (pertussis) after he or she is born. ? You should receive an annual inactivated influenza vaccine (IIV) to help protect you and your baby from influenza. This can be done at any point during your pregnancy.  Eat a well-rounded diet that includes: ? Fresh fruits and vegetables. ? Lean proteins. ? Calcium-rich foods such as milk, yogurt, hard cheeses, and dark, leafy greens. ? Whole grain breads.  Do noteat seafood high in mercury, including: ? Swordfish. ? Tilefish. ? Shark. ? King mackerel. ? More than 6 oz tuna per week.  Do not  eat: ? Raw or undercooked meats or eggs. ? Unpasteurized foods, such as soft cheeses (brie, blue, or feta), juices, and milks. ? Lunch meats. ? Hot dogs that have not been heated until they are steaming.  Drink enough water to keep your urine clear or pale yellow. For many women, this may be 10 or more 8 oz glasses of water each day. Keeping yourself hydrated helps deliver nutrients to your baby and may prevent the start of pre-term uterine contractions.  Do not use any tobacco products including cigarettes, chewing tobacco, or electronic cigarettes. If you need help quitting, ask your health care provider.  Do not drink beverages containing alcohol. No safe level of alcohol consumption during pregnancy has been determined.  Do not use any illegal drugs. These can harm your developing baby or cause a miscarriage.  Ask your health care provider or pharmacist before taking any prescription or over-the-counter medicines, herbs, or supplements.  Limit your caffeine intake to no more than 200 mg per day.  Exercise. Unless told otherwise by your health care provider, try to get 30 minutes of moderate exercise most days of the week. Do not  do high-impact activities, contact sports, or activities with a high risk of falling, such as horseback riding or downhill skiing.  Get plenty of rest.  Avoid anything that raises your  body temperature, such as hot tubs and saunas.  If you own a cat, do not empty its litter box. Bacteria contained in cat feces can cause an infection called toxoplasmosis. This can result in serious harm to the fetus.  Stay away from chemicals such as insecticides, lead, mercury, and cleaning or paint products that contain solvents.  Do not have any X-rays taken unless medically necessary.  Take a childbirth and breastfeeding preparation class. Ask your health care provider if you need a referral or recommendation.  This information is not intended to replace advice given  to you by your health care provider. Make sure you discuss any questions you have with your health care provider. Document Released: 09/30/2003 Document Revised: 03/01/2016 Document Reviewed: 12/12/2013 Elsevier Interactive Patient Education  2017 [place] Education Options: [facility] Department Classes:  Childbirth education classes can help you get ready for a positive parenting experience. You can also meet other expectant parents and get free stuff for your baby. Each class runs for five weeks on the same night and costs $45 for the mother-to-be and her support person. Medicaid covers the cost if you are eligible. Call [phone] to register. [facility] Childbirth Education:  [phone] or [phone] or [email or web address]  Baby & Me Class: Discuss newborn & infant parenting and family adjustment issues with other new mothers in a relaxed environment. Each week brings a new speaker or baby-centered activity. We encourage new mothers to join [place] every Thursday at 11:00am. Babies birth until crawling. No registration or fee. Daddy [employer]: This course offers Dads-to-be the tools and knowledge needed to feel confident on their journey to becoming new fathers. Experienced dads, who have been trained as coaches, teach dads-to-be how to hold, comfort, diaper, swaddle and play with their infant while being able to support the new mom as well. A class for men taught by men. $25/dad Big Brother/Big Sister: Let your children share in the joy of a new brother or sister in this special class designed just for them. Class includes discussion about how families care for babies: swaddling, holding, diapering, safety as well as how they can be helpful in their new role. This class is designed for children ages 43 to 60, but any age is welcome. Please register each child individually. $5/child  Mom Talk: This mom-led group offers support and connection to mothers as  they journey through the adjustments and struggles of that sometimes overwhelming first year after the birth of a child. Tuesdays at 10:00am and Thursdays at 6:00pm. Babies welcome. No registration or fee. Breastfeeding Support Group: This group is a mother-to-mother support circle where moms have the opportunity to share their breastfeeding experiences. A Lactation Consultant is present for questions and concerns. Meets each Tuesday at 11:00am. No fee or registration. Breastfeeding Your Baby: Learn what to expect in the first days of breastfeeding your newborn.  This class will help you feel more confident with the skills needed to begin your breastfeeding experience. Many new mothers are concerned about breastfeeding after leaving the hospital. This class will also address the most common fears and challenges about breastfeeding during the first few weeks, months and beyond. (call for fee) Comfort Techniques and Tour: This 2 hour interactive class will provide you the opportunity to learn & practice hands-on techniques that can help relieve some of the discomfort of labor and encourage your baby to rotate toward the best position for birth. You and your partner will be able to try  a variety of labor positions with birth balls and rebozos as well as practice breathing, relaxation, and visualization techniques. A tour of the [facility] is included with this class. $20 per registrant and support person Childbirth Class- Weekend Option: This class is a Weekend version of our Birth & Baby series. It is designed for parents who have a difficult time fitting several weeks of classes into their schedule. It covers the care of your newborn and the basics of labor and childbirth. It also includes a [place] [facility] and lunch. The class is held two consecutive days: beginning on Friday evening from 6:30 - 8:30 p.m. and the next day, Saturday from 9 a.m. - 4 p.m.  (call for fee) Doren Custard Class: Interested in a waterbirth?  This informational class will help you discover whether waterbirth is the right fit for you. Education about waterbirth itself, supplies you would need and how to assemble your support team is what you can expect from this class. Some obstetrical practices require this class in order to pursue a waterbirth. (Not all obstetrical practices offer waterbirth-check with your healthcare provider.) Register only the expectant mom, but you are encouraged to bring your partner to class! Required if planning waterbirth, no fee. Infant/Child CPR: Parents, grandparents, babysitters, and friends learn Cardio-Pulmonary Resuscitation skills for infants and children. You will also learn how to treat both conscious and unconscious choking in infants and children. This Family & Friends program does not offer certification. Register each participant individually to ensure that enough mannequins are available. (Call for fee) Grandparent Love: Expecting a grandbaby? This class is for you! Learn about the latest infant care and safety recommendations and ways to support your own child as he or she transitions into the parenting role. Taught by Registered Nurses who are childbirth instructors, but most importantly...they are grandmothers too! $10/person. Childbirth Class- Natural Childbirth: This series of 5 weekly classes is for expectant parents who want to learn and practice natural methods of coping with the process of labor and childbirth. Relaxation, breathing, massage, visualization, role of the partner, and helpful positioning are highlighted. Participants learn how to be confident in their body's ability to give birth. This class will empower and help parents make informed decisions about their own care. Includes discussion that will help new parents transition into the immediate postpartum period. [facility] is included. We  suggest taking this class between 25-32 weeks, but it's only a recommendation. $75 per registrant and one support person or $30 Medicaid. Childbirth Class- 3 week Series: This option of 3 weekly classes helps you and your labor partner prepare for childbirth. Newborn care, labor & birth, cesarean birth, pain management, and comfort techniques are discussed and a [place] [facility] is included. The class meets at the same time, on the same day of the week for 3 consecutive weeks beginning with the starting date you choose. $60 for registrant and one support person.  Marvelous Multiples: Expecting twins, triplets, or more? This class covers the differences in labor, birth, parenting, and breastfeeding issues that face multiples' parents. NICU tour is included. Led by a Certified Childbirth Educator who is the mother of twins. No fee. Caring for Baby: This class is for expectant and adoptive parents who want to learn and practice the most up-to-date newborn care for their babies. Focus is on birth through the first six weeks of life. Topics include feeding, bathing, diapering,  crying, umbilical cord care, circumcision care and safe sleep. Parents learn to recognize symptoms of illness and when to call the pediatrician. Register only the mom-to-be and your partner or support person can plan to come with you! $10 per registrant and support person Childbirth Class- online option: This online class offers you the freedom to complete a Birth and Baby series in the comfort of your own home. The flexibility of this option allows you to review sections at your own pace, at times convenient to you and your support people. It includes additional video information, animations, quizzes, and extended activities. Get organized with helpful eClass tools, checklists, and trackers. Once you register online for the class, you will receive an email within a few days to accept the invitation and begin the  class when the time is right for you. The content will be available to you for 60 days. $60 for 60 days of online access for you and your support people.  Local Doulas: Natural Baby Doulas [email or web address] Tel: [phone] [email or web address] [employer] [phone] [email or web address] The Labor Hassell Halim  (also do waterbirth tub rental) [phone] [email or web address] Triad Birth Doula [phone] [email or web address] NotebookDistributors.fi Sacred Rhythms  [phone] https://[email or web address]-[email or web address]/ [employer] (PADA) [email or web address]://[email or web address]/ [place] Birth and Baby  [email or web address]/ Considering Waterbirth? Guide for patients at [facility] [employer]  Why consider waterbirth?  . Gentle birth for babies . Less pain medicine used in labor . May allow for passive descent/less pushing . May reduce perineal tears  . More mobility and instinctive maternal position changes . Increased maternal relaxation . Reduced blood pressure in labor  Is waterbirth safe? What are the risks of infection, drowning or other complications?  . Infection: o Very low risk (3.7 % for tub vs 4.8% for bed) o 7 in 8000 waterbirths with documented infection o Poorly cleaned equipment most common cause o Slightly lower group B strep transmission rate  . Drowning o Maternal:  - Very low risk   - Related to seizures or fainting o Newborn:  - Very low risk. No evidence of increased risk of respiratory problems in multiple large studies - Physiological protection from breathing under water - Avoid underwater birth if there are any fetal complications - Once baby's head is out of the water, keep it out.  . Birth complication o Some reports of cord trauma, but risk decreased by bringing baby to surface  gradually o No evidence of increased risk of shoulder dystocia. Mothers can usually change positions faster in water than in a bed, possibly aiding the maneuvers to free the shoulder.   You must attend a Doren Custard class at [facility]  3rd Wednesday of every month from 7-9pm  [employer] by calling [phone] or online at [email or web address]  Bring [place] the certificate from the class to your prenatal appointment  Meet with a midwife at 36 weeks to see if you can still plan a waterbirth and to sign the consent.   Purchase or rent the following supplies:   Water Birth Pool (Birth Pool in a Box or [place] for instance)  (Tubs start ~$125)  Single-use disposable tub liner designed for your brand of tub  New garden hose labeled "lead-free", "suitable for drinking water",  Electric drain pump to remove water (We recommend 792 gallon per hour or greater pump.)   Separate garden hose to remove the dirty water  Fish net  Bathing suit top (optional)  Long-handled mirror (optional)  Places to purchase or rent supplies  GotWebTools.is for tub purchases and supplies  Waterbirthsolutions.com for tub purchases and supplies  The Labor Ladies (www.[email or web address]) $275 for tub rental/set-up & take down/kit   [employer] (http://www.fleming.com/.htm) Information regarding doulas (labor support) who provide pool rentals  Our practice has a Birth Pool in a Box tub at the hospital that you may borrow on a first-come-first-served basis. It is your responsibility to to set up, clean and break down the tub. We cannot guarantee the availability of this tub in advance. You are responsible for bringing all accessories listed above. If you do not have all necessary supplies you cannot have a waterbirth.    Things that would prevent you from having a waterbirth:  Premature, <37wks  Previous cesarean birth  Presence of thick meconium-stained  fluid  Multiple gestation (Twins, triplets, etc.)  Uncontrolled diabetes or gestational diabetes requiring medication  Hypertension requiring medication or diagnosis of pre-eclampsia  Heavy vaginal bleeding  Non-reassuring fetal heart rate  Active infection (MRSA, etc.). Group B Strep is NOT a contraindication for  waterbirth.  If your labor has to be induced and induction method requires continuous  monitoring of the baby's heart rate  Other risks/issues identified by your obstetrical provider  Please remember that birth is unpredictable. Under certain unforeseeable circumstances your provider may advise against giving birth in the tub. These decisions will be made on a case-by-case basis and with the safety of you and your baby as our highest priority.     AREA PEDIATRIC/FAMILY [place] [address] [place], [place]  [address] Phone - [phone]   Fax - [phone]  [facility] [place] [address] [place], [place] [address] Phone - [phone]   Fax - [place] [address], [place]  [address] Phone - [phone]   Fax - [phone]  [place] [place] [address] [place], [place]  [address] Phone - [phone]   Fax - [phone]  [place] [address] [place], [place]  [address] Phone - [phone]   Fax - [phone]  [facility] [address] [place], [place]  [address] Phone - [phone]   Fax - [place] [address], [place], [place]  [address] Phone - [phone]   Fax - [phone]  [place] [address] [place], [place] [address] [place], [place]  [address] Phone - [phone]   Fax - [place] [address] [place], [place]  [address] Phone - [phone]    Fax - [phone] [facility] [place]. [address] [place], [place]  [address] Phone - [phone]   Fax - [phone]  [place] [address] [place], [place]  [address] Phone - [phone]   Fax - [phone]  [facility] [place], [place], [place]  [address] Phone - [phone]   Fax - [place] [address] [place], [place]  [address] Phone - [phone]   Fax - [phone]  [facility] [address], [place], [place]  [address] Phone - [place], [place]  [address] Phone - [phone]   Fax - [place] [address], [place], [place]  [address] Phone -  [phone]   Fax - [phone]  [place] [address] [place], [place]  [address] Phone - [phone]   Fax - [place], [place]  [address] Phone - [phone]   Fax - [place], [place], [place]  [address] Phone - [phone]   Fax - [place] [address], [place], [place]  [address] Phone - [phone]   Fax - [phone]  [place] [address], [place], [place]  [address] Phone - [phone]   Fax - [place] [address], [place], [place]  [address] Phone - [phone]   Fax - [phone]  [place] [address] [place], [place]  [address] Phone - [phone]   Fax - [phone] [place] [address] [place], [place]  [address] Phone - [phone]   Fax - [place] [address] [place], [place]  [address] Phone - [phone]   Fax -  [place] [address], [place], [place]  [address] Phone - [phone]   Fax - [phone]  [place] [address] [place] [address] Phone [phone]  Fax [phone]  Safe Medications in Pregnancy   Acne:  Benzoyl Peroxide  Salicylic Acid   Backache/Headache:  Tylenol: 2 regular strength every 4 hours OR        2 Extra strength every 6 hours   Colds/Coughs/Allergies:  Benadryl (alcohol free) 25 mg every 6 hours as needed  Breath right strips  Claritin  Cepacol throat lozenges  Chloraseptic throat spray  Cold-Eeze- up to three times per day  Cough drops, alcohol free  Flonase (by prescription only)  Guaifenesin  Mucinex  Robitussin DM (plain only, alcohol free)  Saline nasal spray/drops  Sudafed (pseudoephedrine) & Actifed * use only after [redacted] weeks gestation and if you do not have high blood pressure  Tylenol  Vicks Vaporub  Zinc lozenges  Zyrtec   Constipation:  Colace  Ducolax suppositories  Fleet enema  Glycerin suppositories  Metamucil  Milk of magnesia  Miralax  Senokot  Smooth move tea   Diarrhea:  Kaopectate  Imodium A-D   *NO pepto Bismol   Hemorrhoids:  Anusol  Anusol HC  Preparation H  Tucks   Indigestion:  Tums  Maalox  Mylanta  Zantac  Pepcid   Insomnia:  Benadryl (alcohol free) 5m every 6 hours as needed  Tylenol PM  Unisom, no Gelcaps   Leg Cramps:  Tums  MagGel   Nausea/Vomiting:  Bonine  Dramamine  Emetrol  Ginger extract  Sea bands  Meclizine  Nausea medication to take during pregnancy:  Unisom (doxylamine succinate 25 mg tablets) Take one tablet daily at bedtime. If symptoms are not adequately controlled, the dose can be increased to a maximum recommended dose of two tablets daily (1/2 tablet in the morning, 1/2 tablet mid-afternoon and one at bedtime).  Vitamin B6 1085mtablets.  Take one tablet twice a day (up to 200 mg per day).   Skin Rashes:  Aveeno products  Benadryl cream or 2543mvery 6 hours as needed  Calamine Lotion  1% cortisone cream   Yeast infection:  Gyne-lotrimin 7  Monistat 7    **If taking multiple medications, please check labels to avoid  duplicating the same active ingredients  **take medication as directed on the label  ** Do not exceed 4000 mg of tylenol in 24 hours  **Do not take medications that contain aspirin or ibuprofen

## 2018-01-12 LAB — URINE CULTURE, OB REFLEX

## 2018-01-12 LAB — CULTURE, OB URINE

## 2018-01-20 ENCOUNTER — Encounter (HOSPITAL_COMMUNITY): Payer: Self-pay | Admitting: *Deleted

## 2018-01-20 ENCOUNTER — Encounter: Payer: Self-pay | Admitting: Medical

## 2018-01-20 ENCOUNTER — Telehealth: Payer: Self-pay | Admitting: Medical

## 2018-01-20 ENCOUNTER — Inpatient Hospital Stay (HOSPITAL_COMMUNITY)
Admission: AD | Admit: 2018-01-20 | Discharge: 2018-01-20 | Disposition: A | Discharge status: Home / Self Care | Payer: BLUE CROSS/BLUE SHIELD | Source: Ambulatory Visit | Attending: Obstetrics and Gynecology | Admitting: Obstetrics and Gynecology

## 2018-01-20 DIAGNOSIS — O26851 Spotting complicating pregnancy, first trimester: Secondary | ICD-10-CM | POA: Insufficient documentation

## 2018-01-20 DIAGNOSIS — Z3A12 12 weeks gestation of pregnancy: Secondary | ICD-10-CM | POA: Diagnosis not present

## 2018-01-20 DIAGNOSIS — N939 Abnormal uterine and vaginal bleeding, unspecified: Secondary | ICD-10-CM | POA: Diagnosis present

## 2018-01-20 DIAGNOSIS — O209 Hemorrhage in early pregnancy, unspecified: Secondary | ICD-10-CM | POA: Diagnosis not present

## 2018-01-20 HISTORY — DX: Other specified health status: Z78.9

## 2018-01-20 LAB — URINALYSIS, ROUTINE W REFLEX MICROSCOPIC
BILIRUBIN URINE: NEGATIVE
Glucose, UA: NEGATIVE mg/dL
KETONES UR: NEGATIVE mg/dL
NITRITE: NEGATIVE
PH: 8 (ref 5.0–8.0)
Protein, ur: NEGATIVE mg/dL
SPECIFIC GRAVITY, URINE: 1.009 (ref 1.005–1.030)

## 2018-01-20 LAB — OBSTETRIC PANEL, INCLUDING HIV
ANTIBODY SCREEN: NEGATIVE
BASOS ABS: 0 10*3/uL (ref 0.0–0.2)
BASOS: 0 %
EOS (ABSOLUTE): 0 10*3/uL (ref 0.0–0.4)
EOS: 1 %
HEMATOCRIT: 35 % (ref 34.0–46.6)
HEMOGLOBIN: 11.4 g/dL (ref 11.1–15.9)
HEP B S AG: NEGATIVE
HIV SCREEN 4TH GENERATION: NONREACTIVE
Immature Grans (Abs): 0 10*3/uL (ref 0.0–0.1)
Immature Granulocytes: 0 %
Lymphocytes Absolute: 1.4 10*3/uL (ref 0.7–3.1)
Lymphs: 20 %
MCH: 30.2 pg (ref 26.6–33.0)
MCHC: 32.6 g/dL (ref 31.5–35.7)
MCV: 93 fL (ref 79–97)
Monocytes Absolute: 0.4 10*3/uL (ref 0.1–0.9)
Monocytes: 6 %
NEUTROS ABS: 5.2 10*3/uL (ref 1.4–7.0)
Neutrophils: 73 %
Platelets: 268 10*3/uL (ref 150–379)
RBC: 3.78 x10E6/uL (ref 3.77–5.28)
RDW: 12.6 % (ref 12.3–15.4)
RH TYPE: POSITIVE
RPR: NONREACTIVE
Rubella Antibodies, IGG: 5.29 index (ref >0.99)
WBC: 7 10*3/uL (ref 3.4–10.8)

## 2018-01-20 LAB — WET PREP, GENITAL
Clue Cells Wet Prep HPF POC: NONE SEEN
SPERM: NONE SEEN
TRICH WET PREP: NONE SEEN
YEAST WET PREP: NONE SEEN

## 2018-01-20 LAB — HEMOGLOBINOPATHY EVALUATION
FERRITIN: 53 ng/mL (ref 15–150)
HGB A: 97.6 % (ref 96.4–98.8)
HGB F QUANT: 0 % (ref 0.0–2.0)
Hgb A2 Quant: 2.4 % (ref 1.8–3.2)
Hgb C: 0 %
Hgb S: 0 %
Hgb Solubility: NEGATIVE
Hgb Variant: 0 %

## 2018-01-20 LAB — CYSTIC FIBROSIS GENE TEST

## 2018-01-20 NOTE — MAU Note (Signed)
Pt C/O spotting & lower abdominal pain that started yesterday.  Has HA, denies fever, vomiting or diarrhea.

## 2018-01-20 NOTE — MAU Provider Note (Addendum)
Chief Complaint: Abdominal Pain and Vaginal Bleeding   First Provider Initiated Contact with Patient 01/20/18 1406      SUBJECTIVE HPI: Sara Pineda is a 32 y.o. G2P0010 at [redacted]w[redacted]d by [place] who presents to maternity admissions reporting vaginal spotting and cramping that started yesterday. States that the blood is only seen on the toilet paper when she wipes. Also endorses that she saw a brown substances that she thought may be tissue today. Denies any passage of clots. Patient is also experiencing some lower abdominal cramping since yesterday when the spotting started. She did not take any medication for the cramping.  She denies vaginal discharge, urinary symptoms, contractions, n/v, or fever/chills.    HPI  Past Medical History:  Diagnosis Date  . Medical history non-contributory    Past Surgical History:  Procedure Laterality Date  . DILATION AND EVACUATION N/A 01/06/2016   Procedure: DILATATION AND EVACUATION with Ultrasound Guidance;  Surgeon: Thurnell Lose, MD;  Location: [facility];  Service: Gynecology;  Laterality: N/A;   Social History   Socioeconomic History  . Marital status: Married    Spouse name: Not on file  . Number of children: Not on file  . Years of education: Not on file  . Highest education level: Not on file  Occupational History  . Not on file  Social Needs  . Financial resource strain: Not on file  . Food insecurity:    Worry: Not on file    Inability: Not on file  . Transportation needs:    Medical: Not on file    Non-medical: Not on file  Tobacco Use  . Smoking status: Never Smoker  . Smokeless tobacco: Never Used  Substance and Sexual Activity  . Alcohol use: No  . Drug use: No  . Sexual activity: Yes    Birth control/protection: None    Comment: approx [redacted] wks gestation  Lifestyle  . Physical activity:    Days per week: Not on file    Minutes per session: Not on file  . Stress: Not on file  Relationships  . Social connections:    Talks on  phone: Not on file    Gets together: Not on file    Attends religious service: Not on file    Active member of club or organization: Not on file    Attends meetings of clubs or organizations: Not on file    Relationship status: Not on file  . Intimate partner violence:    Fear of current or ex partner: Not on file    Emotionally abused: Not on file    Physically abused: Not on file    Forced sexual activity: Not on file  Other Topics Concern  . Not on file  Social History Narrative  . Not on file   No current facility-administered medications on file prior to encounter.    Current Outpatient Medications on File Prior to Encounter  Medication Sig Dispense Refill  . acetaminophen (TYLENOL) 325 MG tablet Take 325 mg by mouth every 6 (six) hours as needed.    . Prenatal Vit-Fe Fumarate-FA (PRENATAL MULTIVITAMIN) TABS tablet Take 1 tablet by mouth daily at 12 noon.    . Vitamin D, Ergocalciferol, (DRISDOL) 50000 units CAPS capsule      No Known Allergies  ROS:  Review of Systems  Constitutional: Negative for chills and fever.  Gastrointestinal: Positive for abdominal pain. Negative for nausea and vomiting.  Genitourinary: Positive for vaginal bleeding. Negative for difficulty urinating, dysuria, frequency and urgency.  I have reviewed patient's Past Medical Hx, Surgical Hx, Family Hx, Social Hx, medications and allergies.   Physical Exam   Patient Vitals for the past 24 hrs:  BP Temp Temp src Pulse Resp Height Weight  01/20/18 1240 112/76 98.8 F (37.1 C) Oral 92 20 5\' 2"  (1.575 m) 56.2 kg (124 lb)   Constitutional: Well-developed, well-nourished female in no acute distress.  Cardiovascular: normal rate Respiratory: normal effort Neurologic: Alert and oriented x 4.  PELVIC EXAM: Cervix pink, visually closed, without lesion, scant white-yellow creamy discharge, minimal brown blood, vaginal walls and external genitalia normal Bimanual exam: Cervix 0/long/high, firm, anterior,  neg CMT, uterus nontender, nonenlarged, adnexa without tenderness, enlargement, or mass  FHT 158 by bedside US with active fetal movements and appropriate fluid.   LAB RESULTS Results for orders placed or performed during the hospital encounter of 01/20/18 (from the past 24 hour(s))  Urinalysis, Routine w reflex microscopic     Status: Abnormal   Collection Time: 01/20/18 12:45 PM  Result Value Ref Range   Color, Urine YELLOW YELLOW   APPearance HAZY (A) CLEAR   Specific Gravity, Urine 1.009 1.005 - 1.030   pH 8.0 5.0 - 8.0   Glucose, UA NEGATIVE NEGATIVE mg/dL   Hgb urine dipstick MODERATE (A) NEGATIVE   Bilirubin Urine NEGATIVE NEGATIVE   Ketones, ur NEGATIVE NEGATIVE mg/dL   Protein, ur NEGATIVE NEGATIVE mg/dL   Nitrite NEGATIVE NEGATIVE   Leukocytes, UA MODERATE (A) NEGATIVE   RBC / HPF 0-5 0 - 5 RBC/hpf   WBC, UA 0-5 0 - 5 WBC/hpf   Bacteria, UA MANY (A) NONE SEEN   Squamous Epithelial / LPF 0-5 (A) NONE SEEN   Mucus PRESENT   Wet prep, genital     Status: Abnormal   Collection Time: 01/20/18  3:38 PM  Result Value Ref Range   Yeast Wet Prep HPF POC NONE SEEN NONE SEEN   Trich, Wet Prep NONE SEEN NONE SEEN   Clue Cells Wet Prep HPF POC NONE SEEN NONE SEEN   WBC, Wet Prep HPF POC MANY (A) NONE SEEN   Sperm NONE SEEN     A/Positive/-- (04/02 0954)  IMAGING No results found.  MAU Management/MDM: Orders Placed This Encounter  Procedures  . Culture, OB Urine  . Wet prep, genital  . Urinalysis, Routine w reflex microscopic    No orders of the defined types were placed in this encounter.   ASSESSMENT -Vaginal bleeding in pregnancy    PLAN -Obtain wet prep, urinalysis, and GC/CT  -Keep next prenatal visit.  -Discharge home   [facility], [place] 01/20/2018    CNM attestation:  I have seen and examined this patient and agree with above documentation in the PA's note.   Sara Pineda is a 32 y.o. G2P0010 at [redacted]w[redacted]d reporting spotting since yesterday  morning.  Denies LOF, contractions PE: Patient Vitals for the past 24 hrs:  BP Temp Temp src Pulse Resp Height Weight  01/20/18 1651 - 97.7 F (36.5 C) Oral - 18 - -  01/20/18 1240 112/76 98.8 F (37.1 C) Oral 92 20 5\' 2"  (1.575 m) 124 lb (56.2 kg)   Gen: calm comfortable, NAD Resp: normal effort, no distress Heart: Regular rate Abd: Soft, NT, gravid, S=D Bedside US shows active fetus wasa 158 HR and appropriate fluid.   ROS, labs, PMH reviewed  Orders Placed This Encounter  Procedures  . Culture, OB Urine  . Wet prep, genital  . Urinalysis, Routine w reflex  microscopic   No orders of the defined types were placed in this encounter.   MDM   Assessment: 1. Vaginal bleeding     Plan: - Discharge home in stable condition. - Follow-up as scheduled at your doctor's office for next prenatal visit or sooner as needed if symptoms worsen. - Return to maternity admissions symptoms worsen  [place], [facility] 01/21/2018 10:38 AM

## 2018-01-20 NOTE — Discharge Instructions (Signed)

## 2018-01-20 NOTE — MAU Provider Note (Signed)
Patient Sara Pineda is a 32 y.o. G2P0010 At [redacted]w[redacted]d here with complaints of vaginal bleeding and pain that started yesterday. She  Has a documented IUP and has had an initial prenatal visit at [facility]. She was seen for infertility treatment, but this pregnancy was not the result of infertility treatment.  She denies abnormal discharge; dysuria, N/V, fever or chills.    History     CSN: [id number]  Arrival date and time: 01/20/18 1219   First Provider Initiated Contact with Patient 01/20/18 1406      Chief Complaint  Patient presents with  . Abdominal Pain  . Vaginal Bleeding   Vaginal Bleeding  The patient's primary symptoms include vaginal bleeding. This is a new problem. The current episode started yesterday. Associated symptoms include abdominal pain. Pertinent negatives include no back pain, constipation, diarrhea, urgency or vomiting. The vaginal discharge was normal. The vaginal bleeding is typical of menses. She has not been passing clots. She has not been passing tissue. Nothing aggravates the symptoms. She has tried nothing for the symptoms.    OB History    Gravida  2   Para      Term      Preterm      AB  1   Living        SAB  1   TAB      Ectopic      Multiple      Live Births              Past Medical History:  Diagnosis Date  . Medical history non-contributory     Past Surgical History:  Procedure Laterality Date  . DILATION AND EVACUATION N/A 01/06/2016   Procedure: DILATATION AND EVACUATION with Ultrasound Guidance;  Surgeon: Thurnell Lose, MD;  Location: [facility];  Service: Gynecology;  Laterality: N/A;    History reviewed. No pertinent family history.  Social History   Tobacco Use  . Smoking status: Never Smoker  . Smokeless tobacco: Never Used  Substance Use Topics  . Alcohol use: No  . Drug use: No    Allergies: No Known Allergies  No medications prior to admission.    Review of Systems  Constitutional: Negative.    HENT: Negative.   Respiratory: Negative.   Cardiovascular: Negative.   Gastrointestinal: Positive for abdominal pain. Negative for constipation, diarrhea and vomiting.  Genitourinary: Positive for vaginal bleeding. Negative for urgency.  Musculoskeletal: Negative.  Negative for back pain.  Neurological: Negative.   Hematological: Negative.   Psychiatric/Behavioral: Negative.    Physical Exam   Blood pressure 112/76, pulse 92, temperature 97.7 F (36.5 C), temperature source Oral, resp. rate 18, height 5\' 2"  (1.575 m), weight 124 lb (56.2 kg), last menstrual period 10/20/2017, unknown if currently breastfeeding.  Physical Exam  Constitutional: She appears well-developed.  HENT:  Head: Normocephalic.  Neck: Normal range of motion.  Respiratory: Effort normal.  GI: Soft.  Genitourinary:  Genitourinary Comments: Normal external female genitalia; slight reddish brown blood and mucous in the vagina. No tissue or clots. No CMT, no adnexal or suprapubic tenderness.   Musculoskeletal: Normal range of motion.  Neurological: She is alert.  Skin: Skin is warm and dry.  Psychiatric: She has a normal mood and affect.    MAU Course  Procedures  MDM -Beside US shows IUP with cardiac activity (158 bpm); appropriate fluid and fetal activity.  -Blood type is A pos -wet prep negative -gc chlamydia pending  Assessment and Plan   1.  Vaginal bleeding    2. Patient stable for discharge with bleeding precautions and instructions to return to MAU if she has increased bleeding, pain or other concerning symptom. Patient greatly reassured by [place].  3. Urine culture pending 4. All questions answered; patient to be discharged.   Mervyn Skeeters Bonnetta Allbee CNM 01/20/2018, 10:18 PM

## 2018-01-20 NOTE — Telephone Encounter (Signed)
Husband called to say his wife was spotting, and cramping. Spoke with Almyra Free PA, and she had me to inform the patient to go to the MAU for an evaluation.

## 2018-01-20 NOTE — Progress Notes (Signed)
Wet prep obtained & to lab by this RN.

## 2018-01-21 LAB — CULTURE, OB URINE

## 2018-01-23 LAB — GC/CHLAMYDIA PROBE AMP (~~LOC~~) NOT AT ARMC
CHLAMYDIA, DNA PROBE: NEGATIVE
Neisseria Gonorrhea: NEGATIVE

## 2018-02-08 ENCOUNTER — Encounter: Payer: Self-pay | Admitting: Obstetrics and Gynecology

## 2018-02-08 ENCOUNTER — Ambulatory Visit (INDEPENDENT_AMBULATORY_CARE_PROVIDER_SITE_OTHER): Payer: BLUE CROSS/BLUE SHIELD | Admitting: Obstetrics and Gynecology

## 2018-02-08 VITALS — BP 113/67 | HR 93 | Wt 126.2 lb

## 2018-02-08 DIAGNOSIS — O352XX0 Maternal care for (suspected) hereditary disease in fetus, not applicable or unspecified: Secondary | ICD-10-CM

## 2018-02-08 DIAGNOSIS — Q5128 Other doubling of uterus, other specified: Secondary | ICD-10-CM

## 2018-02-08 DIAGNOSIS — Z3480 Encounter for supervision of other normal pregnancy, unspecified trimester: Secondary | ICD-10-CM

## 2018-02-08 DIAGNOSIS — Z3481 Encounter for supervision of other normal pregnancy, first trimester: Secondary | ICD-10-CM

## 2018-02-08 DIAGNOSIS — O3401 Maternal care for unspecified congenital malformation of uterus, first trimester: Secondary | ICD-10-CM

## 2018-02-08 DIAGNOSIS — Q512 Other doubling of uterus, unspecified: Secondary | ICD-10-CM

## 2018-02-08 MED ORDER — DOCUSATE SODIUM 100 MG PO CAPS: 100.0000 mg | ORAL_CAPSULE | Freq: Every day | ORAL | 2 refills | Status: DC

## 2018-02-08 NOTE — Progress Notes (Signed)
   PRENATAL VISIT NOTE  Subjective:  Sara Pineda is a 32 y.o. G2P0010 at [redacted]w[redacted]d being seen today for ongoing prenatal care.  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  Patient reports no complaints.  Contractions: Not present. Vag. Bleeding: None.  Movement: Absent. Denies leaking of fluid.   The following portions of the patient's history were reviewed and updated as appropriate: allergies, current medications, past family history, past medical history, past social history, past surgical history and problem list. Problem list updated.  Objective:   Vitals:   02/08/18 0831  BP: 113/67  Pulse: 93  Weight: 126 lb 3.2 oz (57.2 kg)    Fetal Status: Fetal Heart Rate (bpm): 157   Movement: Absent     General:  Alert, oriented and cooperative. Patient is in no acute distress.  Skin: Skin is warm and dry. No rash noted.   Cardiovascular: Normal heart rate noted  Respiratory: Normal respiratory effort, no problems with respiration noted  Abdomen: Soft, gravid, appropriate for gestational age.  Pain/Pressure: Present     Pelvic: Cervical exam deferred        Extremities: Normal range of motion.  Edema: None  Mental Status: Normal mood and affect. Normal behavior. Normal judgment and thought content.   Assessment and Plan:  Pregnancy: G2P0010 at 107w2d  1. 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 Supervision of other normal pregnancy, antepartum Panorama today Anatomy scheduled today  3. 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 Kerin Perna, this  pregnancy was achieved wtihout ART   Preterm labor symptoms and general obstetric precautions including but not limited to vaginal bleeding, contractions, leaking of fluid and fetal movement were reviewed in detail with the patient. Please refer to After Visit Summary for other counseling recommendations.  Return in about 1 month (around 03/08/2018) for OB visit (MD).  Future Appointments  Date Time Provider [place]  03/08/2018  8:15 AM [facility] [place]    Sloan Leiter, MD

## 2018-02-15 ENCOUNTER — Encounter: Payer: Self-pay | Admitting: *Deleted

## 2018-02-17 ENCOUNTER — Encounter: Payer: Self-pay | Admitting: *Deleted

## 2018-03-08 ENCOUNTER — Other Ambulatory Visit: Payer: Self-pay | Admitting: Obstetrics and Gynecology

## 2018-03-08 ENCOUNTER — Other Ambulatory Visit (HOSPITAL_COMMUNITY): Payer: Self-pay | Admitting: *Deleted

## 2018-03-08 ENCOUNTER — Ambulatory Visit (HOSPITAL_COMMUNITY)
Admission: RE | Admit: 2018-03-08 | Discharge: 2018-03-08 | Disposition: A | Discharge status: Home / Self Care | Payer: BLUE CROSS/BLUE SHIELD | Source: Ambulatory Visit | Attending: Obstetrics and Gynecology | Admitting: Obstetrics and Gynecology

## 2018-03-08 DIAGNOSIS — Q513 Bicornate uterus: Secondary | ICD-10-CM

## 2018-03-08 DIAGNOSIS — O34592 Maternal care for other abnormalities of gravid uterus, second trimester: Secondary | ICD-10-CM | POA: Diagnosis not present

## 2018-03-08 DIAGNOSIS — Z3A19 19 weeks gestation of pregnancy: Secondary | ICD-10-CM

## 2018-03-08 DIAGNOSIS — Z363 Encounter for antenatal screening for malformations: Secondary | ICD-10-CM | POA: Diagnosis present

## 2018-03-08 DIAGNOSIS — Z3689 Encounter for other specified antenatal screening: Secondary | ICD-10-CM

## 2018-03-08 DIAGNOSIS — O3402 Maternal care for unspecified congenital malformation of uterus, second trimester: Secondary | ICD-10-CM

## 2018-03-08 DIAGNOSIS — Z8489 Family history of other specified conditions: Secondary | ICD-10-CM

## 2018-03-08 DIAGNOSIS — Z3480 Encounter for supervision of other normal pregnancy, unspecified trimester: Secondary | ICD-10-CM

## 2018-03-13 ENCOUNTER — Encounter: Payer: Self-pay | Admitting: Obstetrics and Gynecology

## 2018-03-13 ENCOUNTER — Ambulatory Visit (INDEPENDENT_AMBULATORY_CARE_PROVIDER_SITE_OTHER): Payer: BLUE CROSS/BLUE SHIELD | Admitting: Obstetrics and Gynecology

## 2018-03-13 VITALS — BP 115/77 | HR 96 | Wt 129.8 lb

## 2018-03-13 DIAGNOSIS — O3401 Maternal care for unspecified congenital malformation of uterus, first trimester: Secondary | ICD-10-CM

## 2018-03-13 DIAGNOSIS — Q512 Other doubling of uterus, unspecified: Secondary | ICD-10-CM

## 2018-03-13 DIAGNOSIS — Z3480 Encounter for supervision of other normal pregnancy, unspecified trimester: Secondary | ICD-10-CM

## 2018-03-13 DIAGNOSIS — Q5128 Other doubling of uterus, other specified: Secondary | ICD-10-CM

## 2018-03-13 DIAGNOSIS — O352XX0 Maternal care for (suspected) hereditary disease in fetus, not applicable or unspecified: Secondary | ICD-10-CM

## 2018-03-13 DIAGNOSIS — K068 Other specified disorders of gingiva and edentulous alveolar ridge: Secondary | ICD-10-CM

## 2018-03-13 NOTE — Progress Notes (Signed)
t  PRENATAL VISIT NOTE  Subjective:  Sara Pineda is a 32 y.o. G2P0010 at [redacted]w[redacted]d being seen today for ongoing prenatal care.  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  Patient reports no complaints. Gums bleeding occasionally. Contractions: Not present. Vag. Bleeding: None.  Movement: Present. Denies leaking of fluid.   The following portions of the patient's history were reviewed and updated as appropriate: allergies, current medications, past family history, past medical history, past social history, past surgical history and problem list. Problem list updated.  Objective:   Vitals:   03/13/18 0919  BP: 115/77  Pulse: 96  Weight: 129 lb 12.8 oz (58.9 kg)    Fetal Status: Fetal Heart Rate (bpm): 138   Movement: Present     General:  Alert, oriented and cooperative. Patient is in no acute distress.  Skin: Skin is warm and dry. No rash noted.   Cardiovascular: Normal heart rate noted  Respiratory: Normal respiratory effort, no problems with respiration noted  Abdomen: Soft, gravid, appropriate for gestational age.  Pain/Pressure: Present     Pelvic: Cervical exam deferred        Extremities: Normal range of motion.  Edema: None  Mental Status: Normal mood and affect. Normal behavior. Normal judgment and thought content.   Assessment and Plan:  Pregnancy: G2P0010 at [redacted]w[redacted]d  1. 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 Supervision of other normal pregnancy, antepartum  3. 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 Bleeding  gums Encouraged good dental hygiene - Ambulatory referral to Dentistry    Preterm labor symptoms and general obstetric precautions including but not limited to vaginal bleeding, contractions, leaking of fluid and fetal movement were reviewed in detail with the patient. Please refer to After Visit Summary for other counseling recommendations.  Return in about 1 month (around 04/10/2018) for OB visit (MD).  Future Appointments  Date Time Provider [place]  04/05/2018 10:00 AM [facility] [place] 3 [facility]    Sloan Leiter, MD

## 2018-03-13 NOTE — Progress Notes (Signed)
Pts states having cramps in her legs alot

## 2018-04-05 ENCOUNTER — Ambulatory Visit (HOSPITAL_COMMUNITY)
Admission: RE | Admit: 2018-04-05 | Discharge: 2018-04-05 | Disposition: A | Discharge status: Home / Self Care | Payer: BLUE CROSS/BLUE SHIELD | Source: Ambulatory Visit | Attending: Obstetrics and Gynecology | Admitting: Obstetrics and Gynecology

## 2018-04-05 ENCOUNTER — Encounter (HOSPITAL_COMMUNITY): Payer: Self-pay

## 2018-04-05 DIAGNOSIS — O3402 Maternal care for unspecified congenital malformation of uterus, second trimester: Secondary | ICD-10-CM | POA: Insufficient documentation

## 2018-04-05 DIAGNOSIS — Q513 Bicornate uterus: Secondary | ICD-10-CM | POA: Insufficient documentation

## 2018-04-05 DIAGNOSIS — Z8489 Family history of other specified conditions: Secondary | ICD-10-CM | POA: Diagnosis not present

## 2018-04-05 DIAGNOSIS — O34592 Maternal care for other abnormalities of gravid uterus, second trimester: Secondary | ICD-10-CM | POA: Insufficient documentation

## 2018-04-05 DIAGNOSIS — Z362 Encounter for other antenatal screening follow-up: Secondary | ICD-10-CM | POA: Diagnosis not present

## 2018-04-05 DIAGNOSIS — Z3A23 23 weeks gestation of pregnancy: Secondary | ICD-10-CM | POA: Diagnosis not present

## 2018-04-06 ENCOUNTER — Other Ambulatory Visit (HOSPITAL_COMMUNITY): Payer: Self-pay | Admitting: *Deleted

## 2018-04-06 DIAGNOSIS — Q513 Bicornate uterus: Principal | ICD-10-CM

## 2018-04-06 DIAGNOSIS — O3402 Maternal care for unspecified congenital malformation of uterus, second trimester: Secondary | ICD-10-CM

## 2018-04-11 ENCOUNTER — Ambulatory Visit (INDEPENDENT_AMBULATORY_CARE_PROVIDER_SITE_OTHER): Payer: BLUE CROSS/BLUE SHIELD | Admitting: Obstetrics and Gynecology

## 2018-04-11 VITALS — BP 103/62 | HR 87 | Wt 136.2 lb

## 2018-04-11 DIAGNOSIS — O3402 Maternal care for unspecified congenital malformation of uterus, second trimester: Secondary | ICD-10-CM

## 2018-04-11 DIAGNOSIS — Z3480 Encounter for supervision of other normal pregnancy, unspecified trimester: Secondary | ICD-10-CM

## 2018-04-11 DIAGNOSIS — K068 Other specified disorders of gingiva and edentulous alveolar ridge: Secondary | ICD-10-CM

## 2018-04-11 DIAGNOSIS — O352XX0 Maternal care for (suspected) hereditary disease in fetus, not applicable or unspecified: Secondary | ICD-10-CM

## 2018-04-11 NOTE — Patient Instructions (Signed)
Gingivitis Gingivitis is redness, soreness, and swelling (inflammation) of the gums. It is caused by germs (plaque) that build up on the teeth and gums. This happens because of poor care and cleaning of the mouth and teeth (oral hygiene). This condition is usually mild and clears up with treatment and proper cleaning of the teeth. Follow these instructions at home:  Follow instructions from your dentist about how to clean your teeth. Make sure you: ? Brush your teeth two times per day with a soft-bristled toothbrush. ? Floss at least one time per day. Do this before you brush your teeth. ? Use a mouthwash as told by your dentist.  Eat a well-balanced diet. Between meals, limit your intake of foods and drinks that have sugar in them.  See a dentist on a regular basis for cleaning and checkups.  Do not use tobacco products. These include cigarettes, chewing tobacco, or e-cigarettes. If you need help quitting, ask your doctor.  Keep all follow-up visits as told by your dentist. This is important. Contact a doctor if:  You have a fever.  You have a lot of bleeding from your gums.  You have pain in your gums or teeth.  You have trouble chewing.  You have any loose or infected teeth.  You have swollen glands in your face or neck. This information is not intended to replace advice given to you by your health care provider. Make sure you discuss any questions you have with your health care provider. Document Released: 10/30/2010 Document Revised: 03/04/2016 Document Reviewed: 05/12/2015 Elsevier Interactive Patient Education  [employer].

## 2018-04-11 NOTE — Progress Notes (Signed)
At patient request called [facility] and left message re: making referral. Gave patient number for dental office and instructions to pick up dental letter. Let us know if she has any problems getting appt.

## 2018-04-11 NOTE — Progress Notes (Signed)
Subjective:  Sara Pineda is a 32 y.o. G2P0010 at [redacted]w[redacted]d being seen today for ongoing prenatal care.  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  Patient reports continued bleeding gums..  Contractions: Not present. Vag. Bleeding: None.  Movement: Present. Denies leaking of fluid.   The following portions of the patient's history were reviewed and updated as appropriate: allergies, current medications, past family history, past medical history, past social history, past surgical history and problem list. Problem list updated.  Objective:   Vitals:   04/11/18 1316  BP: 103/62  Pulse: 87  Weight: 61.8 kg (136 lb 3.2 oz)    Fetal Status: Fetal Heart Rate (bpm): 152 Fundal Height: 25 cm Movement: Present     General:  Alert, oriented and cooperative. Patient is in no acute distress.  Skin: Skin is warm and dry. No rash noted.   Cardiovascular: Normal heart rate noted  Respiratory: Normal respiratory effort, no problems with respiration noted  Abdomen: Soft, gravid, appropriate for gestational age. Pain/Pressure: Present     Pelvic: Vag. Bleeding: None     Cervical exam deferred        Extremities: Normal range of motion.  Edema: Trace  Mental Status: Normal mood and affect. Normal behavior. Normal judgment and thought content.   Urinalysis:      Assessment and Plan:  Pregnancy: G2P0010 at [redacted]w[redacted]d  1. Supervision of other normal pregnancy, antepartum Continue routine care. Doing well.   2. 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 Growth US's scheduled.   4. Bleeding gums Dental referral made. Encouraged good oral hygiene  Preterm labor symptoms and general obstetric precautions including but not limited to vaginal bleeding, contractions, leaking of fluid and fetal movement were reviewed in detail with the patient. Please refer to After Visit Summary for other counseling recommendations.  Return in about 1 month (around 05/09/2018) for ob visit, labs.   Katheren Shams, DO

## 2018-05-03 ENCOUNTER — Ambulatory Visit (HOSPITAL_COMMUNITY)
Admission: RE | Admit: 2018-05-03 | Discharge: 2018-05-03 | Disposition: A | Discharge status: Home / Self Care | Payer: BLUE CROSS/BLUE SHIELD | Source: Ambulatory Visit | Attending: Obstetrics and Gynecology | Admitting: Obstetrics and Gynecology

## 2018-05-10 ENCOUNTER — Other Ambulatory Visit: Payer: Self-pay | Admitting: *Deleted

## 2018-05-10 DIAGNOSIS — Z3480 Encounter for supervision of other normal pregnancy, unspecified trimester: Secondary | ICD-10-CM

## 2018-05-11 ENCOUNTER — Encounter: Payer: Self-pay | Admitting: Obstetrics and Gynecology

## 2018-05-11 ENCOUNTER — Other Ambulatory Visit: Payer: BLUE CROSS/BLUE SHIELD

## 2018-05-11 ENCOUNTER — Ambulatory Visit (INDEPENDENT_AMBULATORY_CARE_PROVIDER_SITE_OTHER): Payer: BLUE CROSS/BLUE SHIELD | Admitting: Obstetrics and Gynecology

## 2018-05-11 VITALS — BP 120/76 | HR 79 | Wt 139.3 lb

## 2018-05-11 DIAGNOSIS — Q512 Other doubling of uterus, unspecified: Secondary | ICD-10-CM

## 2018-05-11 DIAGNOSIS — Z23 Encounter for immunization: Secondary | ICD-10-CM | POA: Diagnosis not present

## 2018-05-11 DIAGNOSIS — Z3483 Encounter for supervision of other normal pregnancy, third trimester: Secondary | ICD-10-CM

## 2018-05-11 DIAGNOSIS — Z3480 Encounter for supervision of other normal pregnancy, unspecified trimester: Secondary | ICD-10-CM

## 2018-05-11 DIAGNOSIS — K068 Other specified disorders of gingiva and edentulous alveolar ridge: Secondary | ICD-10-CM

## 2018-05-11 DIAGNOSIS — O3401 Maternal care for unspecified congenital malformation of uterus, first trimester: Secondary | ICD-10-CM

## 2018-05-11 DIAGNOSIS — O3403 Maternal care for unspecified congenital malformation of uterus, third trimester: Secondary | ICD-10-CM

## 2018-05-11 DIAGNOSIS — O352XX0 Maternal care for (suspected) hereditary disease in fetus, not applicable or unspecified: Secondary | ICD-10-CM

## 2018-05-11 NOTE — Progress Notes (Signed)
Subjective:  Sara Pineda is a 32 y.o. G2P0010 at [redacted]w[redacted]d being seen today for ongoing prenatal care.  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  Patient reports no complaints.  Contractions: Not present. Vag. Bleeding: None.  Movement: Present. Denies leaking of fluid.   The following portions of the patient's history were reviewed and updated as appropriate: allergies, current medications, past family history, past medical history, past social history, past surgical history and problem list. Problem list updated.  Objective:   Vitals:   05/11/18 0903  BP: 120/76  Pulse: 79  Weight: 139 lb 4.8 oz (63.2 kg)    Fetal Status: Fetal Heart Rate (bpm): 142   Movement: Present     General:  Alert, oriented and cooperative. Patient is in no acute distress.  Skin: Skin is warm and dry. No rash noted.   Cardiovascular: Normal heart rate noted  Respiratory: Normal respiratory effort, no problems with respiration noted  Abdomen: Soft, gravid, appropriate for gestational age. Pain/Pressure: Present     Pelvic:  Cervical exam deferred        Extremities: Normal range of motion.  Edema: Trace  Mental Status: Normal mood and affect. Normal behavior. Normal judgment and thought content.   Urinalysis:      Assessment and Plan:  Pregnancy: G2P0010 at [redacted]w[redacted]d  1. Supervision of other normal pregnancy, antepartum Stable - Tdap vaccine greater than or equal to 7yo IM  2. 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 No S/Sx of PTL  3. 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 Bleeding gums Gum care reviewed with pt.   Preterm labor symptoms and general obstetric precautions including but not limited to vaginal bleeding, contractions, leaking of fluid and fetal movement were reviewed in detail with the patient. Please refer to After Visit Summary for other counseling recommendations.  Return in about 2 weeks (around 05/25/2018) for OB visit.   Chancy Milroy, MD

## 2018-05-11 NOTE — Patient Instructions (Signed)
Third Trimester of Pregnancy The third trimester is from week 28 through week 40 (months 7 through 9). The third trimester is a time when the unborn baby (fetus) is growing rapidly. At the end of the ninth month, the fetus is about 20 inches in length and weighs 6-10 pounds. Body changes during your third trimester Your body will continue to go through many changes during pregnancy. The changes vary from woman to woman. During the third trimester:  Your weight will continue to increase. You can expect to gain 25-35 pounds (11-16 kg) by the end of the pregnancy.  You may begin to get stretch marks on your hips, abdomen, and breasts.  You may urinate more often because the fetus is moving lower into your pelvis and pressing on your bladder.  You may develop or continue to have heartburn. This is caused by increased hormones that slow down muscles in the digestive tract.  You may develop or continue to have constipation because increased hormones slow digestion and cause the muscles that push waste through your intestines to relax.  You may develop hemorrhoids. These are swollen veins (varicose veins) in the rectum that can itch or be painful.  You may develop swollen, bulging veins (varicose veins) in your legs.  You may have increased body aches in the pelvis, back, or thighs. This is due to weight gain and increased hormones that are relaxing your joints.  You may have changes in your hair. These can include thickening of your hair, rapid growth, and changes in texture. Some women also have hair loss during or after pregnancy, or hair that feels dry or thin. Your hair will most likely return to normal after your baby is born.  Your breasts will continue to grow and they will continue to become tender. A yellow fluid (colostrum) may leak from your breasts. This is the first milk you are producing for your baby.  Your belly button may stick out.  You may notice more swelling in your hands,  face, or ankles.  You may have increased tingling or numbness in your hands, arms, and legs. The skin on your belly may also feel numb.  You may feel short of breath because of your expanding uterus.  You may have more problems sleeping. This can be caused by the size of your belly, increased need to urinate, and an increase in your body's metabolism.  You may notice the fetus "dropping," or moving lower in your abdomen (lightening).  You may have increased vaginal discharge.  You may notice your joints feel loose and you may have pain around your pelvic bone.  What to expect at prenatal visits You will have prenatal exams every 2 weeks until week 36. Then you will have weekly prenatal exams. During a routine prenatal visit:  You will be weighed to make sure you and the baby are growing normally.  Your blood pressure will be taken.  Your abdomen will be measured to track your baby's growth.  The fetal heartbeat will be listened to.  Any test results from the previous visit will be discussed.  You may have a cervical check near your due date to see if your cervix has softened or thinned (effaced).  You will be tested for Group B streptococcus. This happens between 35 and 37 weeks.  Your health care provider may ask you:  What your birth plan is.  How you are feeling.  If you are feeling the baby move.  If you have had   any abnormal symptoms, such as leaking fluid, bleeding, severe headaches, or abdominal cramping.  If you are using any tobacco products, including cigarettes, chewing tobacco, and electronic cigarettes.  If you have any questions.  Other tests or screenings that may be performed during your third trimester include:  Blood tests that check for low iron levels (anemia).  Fetal testing to check the health, activity level, and growth of the fetus. Testing is done if you have certain medical conditions or if there are problems during the  pregnancy.  Nonstress test (NST). This test checks the health of your baby to make sure there are no signs of problems, such as the baby not getting enough oxygen. During this test, a belt is placed around your belly. The baby is made to move, and its heart rate is monitored during movement.  What is false labor? False labor is a condition in which you feel small, irregular tightenings of the muscles in the womb (contractions) that usually go away with rest, changing position, or drinking water. These are called Braxton Hicks contractions. Contractions may last for hours, days, or even weeks before true labor sets in. If contractions come at regular intervals, become more frequent, increase in intensity, or become painful, you should see your health care provider. What are the signs of labor?  Abdominal cramps.  Regular contractions that start at 10 minutes apart and become stronger and more frequent with time.  Contractions that start on the top of the uterus and spread down to the lower abdomen and back.  Increased pelvic pressure and dull back pain.  A watery or bloody mucus discharge that comes from the vagina.  Leaking of amniotic fluid. This is also known as your "water breaking." It could be a slow trickle or a gush. Let your health care provider know if it has a color or strange odor. If you have any of these signs, call your health care provider right away, even if it is before your due date. Follow these instructions at home: Medicines  Follow your health care provider's instructions regarding medicine use. Specific medicines may be either safe or unsafe to take during pregnancy.  Take a prenatal vitamin that contains at least 600 micrograms (mcg) of folic acid.  If you develop constipation, try taking a stool softener if your health care provider approves. Eating and drinking  Eat a balanced diet that includes fresh fruits and vegetables, whole grains, good sources of protein  such as meat, eggs, or tofu, and low-fat dairy. Your health care provider will help you determine the amount of weight gain that is right for you.  Avoid raw meat and uncooked cheese. These carry germs that can cause birth defects in the baby.  If you have low calcium intake from food, talk to your health care provider about whether you should take a daily calcium supplement.  Eat four or five small meals rather than three large meals a day.  Limit foods that are high in fat and processed sugars, such as fried and sweet foods.  To prevent constipation: ? Drink enough fluid to keep your urine clear or pale yellow. ? Eat foods that are high in fiber, such as fresh fruits and vegetables, whole grains, and beans. Activity  Exercise only as directed by your health care provider. Most women can continue their usual exercise routine during pregnancy. Try to exercise for 30 minutes at least 5 days a week. Stop exercising if you experience uterine contractions.  Avoid heavy   lifting.  Do not exercise in extreme heat or humidity, or at high altitudes.  Wear low-heel, comfortable shoes.  Practice good posture.  You may continue to have sex unless your health care provider tells you otherwise. Relieving pain and discomfort  Take frequent breaks and rest with your legs elevated if you have leg cramps or low back pain.  Take warm sitz baths to soothe any pain or discomfort caused by hemorrhoids. Use hemorrhoid cream if your health care provider approves.  Wear a good support bra to prevent discomfort from breast tenderness.  If you develop varicose veins: ? Wear support pantyhose or compression stockings as told by your healthcare provider. ? Elevate your feet for 15 minutes, 3-4 times a day. Prenatal care  Write down your questions. Take them to your prenatal visits.  Keep all your prenatal visits as told by your health care provider. This is important. Safety  Wear your seat belt at  all times when driving.  Make a list of emergency phone numbers, including numbers for family, friends, the hospital, and police and fire departments. General instructions  Avoid cat litter boxes and soil used by cats. These carry germs that can cause birth defects in the baby. If you have a cat, ask someone to clean the litter box for you.  Do not travel far distances unless it is absolutely necessary and only with the approval of your health care provider.  Do not use hot tubs, steam rooms, or saunas.  Do not drink alcohol.  Do not use any products that contain nicotine or tobacco, such as cigarettes and e-cigarettes. If you need help quitting, ask your health care provider.  Do not use any medicinal herbs or unprescribed drugs. These chemicals affect the formation and growth of the baby.  Do not douche or use tampons or scented sanitary pads.  Do not cross your legs for long periods of time.  To prepare for the arrival of your baby: ? Take prenatal classes to understand, practice, and ask questions about labor and delivery. ? Make a trial run to the hospital. ? Visit the hospital and tour the maternity area. ? Arrange for maternity or paternity leave through employers. ? Arrange for family and friends to take care of pets while you are in the hospital. ? Purchase a rear-facing car seat and make sure you know how to install it in your car. ? Pack your hospital bag. ? Prepare the baby's nursery. Make sure to remove all pillows and stuffed animals from the baby's crib to prevent suffocation.  Visit your dentist if you have not gone during your pregnancy. Use a soft toothbrush to brush your teeth and be gentle when you floss. Contact a health care provider if:  You are unsure if you are in labor or if your water has broken.  You become dizzy.  You have mild pelvic cramps, pelvic pressure, or nagging pain in your abdominal area.  You have lower back pain.  You have persistent  nausea, vomiting, or diarrhea.  You have an unusual or bad smelling vaginal discharge.  You have pain when you urinate. Get help right away if:  Your water breaks before 37 weeks.  You have regular contractions less than 5 minutes apart before 37 weeks.  You have a fever.  You are leaking fluid from your vagina.  You have spotting or bleeding from your vagina.  You have severe abdominal pain or cramping.  You have rapid weight loss or weight gain.    You have shortness of breath with chest pain.  You notice sudden or extreme swelling of your face, hands, ankles, feet, or legs.  Your baby makes fewer than 10 movements in 2 hours.  You have severe headaches that do not go away when you take medicine.  You have vision changes. Summary  The third trimester is from week 28 through week 40, months 7 through 9. The third trimester is a time when the unborn baby (fetus) is growing rapidly.  During the third trimester, your discomfort may increase as you and your baby continue to gain weight. You may have abdominal, leg, and back pain, sleeping problems, and an increased need to urinate.  During the third trimester your breasts will keep growing and they will continue to become tender. A yellow fluid (colostrum) may leak from your breasts. This is the first milk you are producing for your baby.  False labor is a condition in which you feel small, irregular tightenings of the muscles in the womb (contractions) that eventually go away. These are called Braxton Hicks contractions. Contractions may last for hours, days, or even weeks before true labor sets in.  Signs of labor can include: abdominal cramps; regular contractions that start at 10 minutes apart and become stronger and more frequent with time; watery or bloody mucus discharge that comes from the vagina; increased pelvic pressure and dull back pain; and leaking of amniotic fluid. This information is not intended to replace advice  given to you by your health care provider. Make sure you discuss any questions you have with your health care provider. Document Released: 09/21/2001 Document Revised: 03/04/2016 Document Reviewed: 11/28/2012 [employer] Interactive Patient Education  2017 [employer]  

## 2018-05-12 ENCOUNTER — Ambulatory Visit (HOSPITAL_COMMUNITY)
Admission: RE | Admit: 2018-05-12 | Discharge: 2018-05-12 | Disposition: A | Discharge status: Home / Self Care | Payer: BLUE CROSS/BLUE SHIELD | Source: Ambulatory Visit | Attending: Obstetrics and Gynecology | Admitting: Obstetrics and Gynecology

## 2018-05-12 ENCOUNTER — Other Ambulatory Visit (HOSPITAL_COMMUNITY): Payer: Self-pay | Admitting: Obstetrics and Gynecology

## 2018-05-12 ENCOUNTER — Other Ambulatory Visit (HOSPITAL_COMMUNITY): Payer: Self-pay | Admitting: *Deleted

## 2018-05-12 ENCOUNTER — Encounter (HOSPITAL_COMMUNITY): Payer: Self-pay

## 2018-05-12 DIAGNOSIS — Z0489 Encounter for examination and observation for other specified reasons: Secondary | ICD-10-CM

## 2018-05-12 DIAGNOSIS — Q513 Bicornate uterus: Secondary | ICD-10-CM | POA: Insufficient documentation

## 2018-05-12 DIAGNOSIS — O34593 Maternal care for other abnormalities of gravid uterus, third trimester: Secondary | ICD-10-CM | POA: Diagnosis not present

## 2018-05-12 DIAGNOSIS — IMO0002 Reserved for concepts with insufficient information to code with codable children: Secondary | ICD-10-CM

## 2018-05-12 DIAGNOSIS — O3402 Maternal care for unspecified congenital malformation of uterus, second trimester: Secondary | ICD-10-CM

## 2018-05-12 DIAGNOSIS — Z3A28 28 weeks gestation of pregnancy: Secondary | ICD-10-CM | POA: Insufficient documentation

## 2018-05-12 DIAGNOSIS — Z843 Family history of consanguinity: Secondary | ICD-10-CM

## 2018-05-12 LAB — RPR: RPR: NONREACTIVE

## 2018-05-12 LAB — GLUCOSE TOLERANCE, 2 HOURS W/ 1HR
GLUCOSE, 1 HOUR: 145 mg/dL (ref 65–179)
GLUCOSE, 2 HOUR: 129 mg/dL (ref 65–152)
Glucose, Fasting: 67 mg/dL (ref 65–91)

## 2018-05-12 LAB — CBC
HEMATOCRIT: 36.4 % (ref 34.0–46.6)
HEMOGLOBIN: 11.5 g/dL (ref 11.1–15.9)
MCH: 30 pg (ref 26.6–33.0)
MCHC: 31.6 g/dL (ref 31.5–35.7)
MCV: 95 fL (ref 79–97)
Platelets: 272 10*3/uL (ref 150–450)
RBC: 3.83 x10E6/uL (ref 3.77–5.28)
RDW: 13 % (ref 12.3–15.4)
WBC: 8.6 10*3/uL (ref 3.4–10.8)

## 2018-05-12 LAB — HIV ANTIBODY (ROUTINE TESTING W REFLEX): HIV Screen 4th Generation wRfx: NONREACTIVE

## 2018-05-26 ENCOUNTER — Encounter: Payer: Self-pay | Admitting: Obstetrics and Gynecology

## 2018-05-26 ENCOUNTER — Ambulatory Visit (INDEPENDENT_AMBULATORY_CARE_PROVIDER_SITE_OTHER): Payer: BLUE CROSS/BLUE SHIELD | Admitting: Obstetrics and Gynecology

## 2018-05-26 ENCOUNTER — Encounter: Payer: Self-pay | Admitting: Family Medicine

## 2018-05-26 VITALS — BP 122/78 | HR 88 | Wt 145.6 lb

## 2018-05-26 DIAGNOSIS — O3403 Maternal care for unspecified congenital malformation of uterus, third trimester: Secondary | ICD-10-CM

## 2018-05-26 DIAGNOSIS — Z3483 Encounter for supervision of other normal pregnancy, third trimester: Secondary | ICD-10-CM

## 2018-05-26 DIAGNOSIS — Z3480 Encounter for supervision of other normal pregnancy, unspecified trimester: Secondary | ICD-10-CM

## 2018-05-26 DIAGNOSIS — O3402 Maternal care for unspecified congenital malformation of uterus, second trimester: Secondary | ICD-10-CM

## 2018-05-26 NOTE — Progress Notes (Signed)
Subjective:  Sara Pineda is a 32 y.o. G2P0010 at [redacted]w[redacted]d being seen today for ongoing prenatal care.  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  Patient reports general discomfrots of pregnancy.  Contractions: Not present. Vag. Bleeding: None.  Movement: Present. Denies leaking of fluid.   The following portions of the patient's history were reviewed and updated as appropriate: allergies, current medications, past family history, past medical history, past social history, past surgical history and problem list. Problem list updated.  Objective:   Vitals:   05/26/18 0845  BP: 122/78  Pulse: 88  Weight: 145 lb 9.6 oz (66 kg)    Fetal Status: Fetal Heart Rate (bpm): 132   Movement: Present     General:  Alert, oriented and cooperative. Patient is in no acute distress.  Skin: Skin is warm and dry. No rash noted.   Cardiovascular: Normal heart rate noted  Respiratory: Normal respiratory effort, no problems with respiration noted  Abdomen: Soft, gravid, appropriate for gestational age. Pain/Pressure: Present     Pelvic:  Cervical exam deferred        Extremities: Normal range of motion.  Edema: Trace  Mental Status: Normal mood and affect. Normal behavior. Normal judgment and thought content.   Urinalysis:      Assessment and Plan:  Pregnancy: G2P0010 at [redacted]w[redacted]d  1. Supervision of other normal pregnancy, antepartum Stable  2. 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 Please refer to After Visit Summary for other counseling recommendations.  Return in about 2 weeks (around 06/09/2018) for OB visit.   Chancy Milroy, MD

## 2018-05-26 NOTE — Patient Instructions (Signed)
Third Trimester of Pregnancy The third trimester is from week 28 through week 40 (months 7 through 9). The third trimester is a time when the unborn baby (fetus) is growing rapidly. At the end of the ninth month, the fetus is about 20 inches in length and weighs 6-10 pounds. Body changes during your third trimester Your body will continue to go through many changes during pregnancy. The changes vary from woman to woman. During the third trimester:  Your weight will continue to increase. You can expect to gain 25-35 pounds (11-16 kg) by the end of the pregnancy.  You may begin to get stretch marks on your hips, abdomen, and breasts.  You may urinate more often because the fetus is moving lower into your pelvis and pressing on your bladder.  You may develop or continue to have heartburn. This is caused by increased hormones that slow down muscles in the digestive tract.  You may develop or continue to have constipation because increased hormones slow digestion and cause the muscles that push waste through your intestines to relax.  You may develop hemorrhoids. These are swollen veins (varicose veins) in the rectum that can itch or be painful.  You may develop swollen, bulging veins (varicose veins) in your legs.  You may have increased body aches in the pelvis, back, or thighs. This is due to weight gain and increased hormones that are relaxing your joints.  You may have changes in your hair. These can include thickening of your hair, rapid growth, and changes in texture. Some women also have hair loss during or after pregnancy, or hair that feels dry or thin. Your hair will most likely return to normal after your baby is born.  Your breasts will continue to grow and they will continue to become tender. A yellow fluid (colostrum) may leak from your breasts. This is the first milk you are producing for your baby.  Your belly button may stick out.  You may notice more swelling in your hands,  face, or ankles.  You may have increased tingling or numbness in your hands, arms, and legs. The skin on your belly may also feel numb.  You may feel short of breath because of your expanding uterus.  You may have more problems sleeping. This can be caused by the size of your belly, increased need to urinate, and an increase in your body's metabolism.  You may notice the fetus "dropping," or moving lower in your abdomen (lightening).  You may have increased vaginal discharge.  You may notice your joints feel loose and you may have pain around your pelvic bone.  What to expect at prenatal visits You will have prenatal exams every 2 weeks until week 36. Then you will have weekly prenatal exams. During a routine prenatal visit:  You will be weighed to make sure you and the baby are growing normally.  Your blood pressure will be taken.  Your abdomen will be measured to track your baby's growth.  The fetal heartbeat will be listened to.  Any test results from the previous visit will be discussed.  You may have a cervical check near your due date to see if your cervix has softened or thinned (effaced).  You will be tested for Group B streptococcus. This happens between 35 and 37 weeks.  Your health care provider may ask you:  What your birth plan is.  How you are feeling.  If you are feeling the baby move.  If you have had   any abnormal symptoms, such as leaking fluid, bleeding, severe headaches, or abdominal cramping.  If you are using any tobacco products, including cigarettes, chewing tobacco, and electronic cigarettes.  If you have any questions.  Other tests or screenings that may be performed during your third trimester include:  Blood tests that check for low iron levels (anemia).  Fetal testing to check the health, activity level, and growth of the fetus. Testing is done if you have certain medical conditions or if there are problems during the  pregnancy.  Nonstress test (NST). This test checks the health of your baby to make sure there are no signs of problems, such as the baby not getting enough oxygen. During this test, a belt is placed around your belly. The baby is made to move, and its heart rate is monitored during movement.  What is false labor? False labor is a condition in which you feel small, irregular tightenings of the muscles in the womb (contractions) that usually go away with rest, changing position, or drinking water. These are called Braxton Hicks contractions. Contractions may last for hours, days, or even weeks before true labor sets in. If contractions come at regular intervals, become more frequent, increase in intensity, or become painful, you should see your health care provider. What are the signs of labor?  Abdominal cramps.  Regular contractions that start at 10 minutes apart and become stronger and more frequent with time.  Contractions that start on the top of the uterus and spread down to the lower abdomen and back.  Increased pelvic pressure and dull back pain.  A watery or bloody mucus discharge that comes from the vagina.  Leaking of amniotic fluid. This is also known as your "water breaking." It could be a slow trickle or a gush. Let your health care provider know if it has a color or strange odor. If you have any of these signs, call your health care provider right away, even if it is before your due date. Follow these instructions at home: Medicines  Follow your health care provider's instructions regarding medicine use. Specific medicines may be either safe or unsafe to take during pregnancy.  Take a prenatal vitamin that contains at least 600 micrograms (mcg) of folic acid.  If you develop constipation, try taking a stool softener if your health care provider approves. Eating and drinking  Eat a balanced diet that includes fresh fruits and vegetables, whole grains, good sources of protein  such as meat, eggs, or tofu, and low-fat dairy. Your health care provider will help you determine the amount of weight gain that is right for you.  Avoid raw meat and uncooked cheese. These carry germs that can cause birth defects in the baby.  If you have low calcium intake from food, talk to your health care provider about whether you should take a daily calcium supplement.  Eat four or five small meals rather than three large meals a day.  Limit foods that are high in fat and processed sugars, such as fried and sweet foods.  To prevent constipation: ? Drink enough fluid to keep your urine clear or pale yellow. ? Eat foods that are high in fiber, such as fresh fruits and vegetables, whole grains, and beans. Activity  Exercise only as directed by your health care provider. Most women can continue their usual exercise routine during pregnancy. Try to exercise for 30 minutes at least 5 days a week. Stop exercising if you experience uterine contractions.  Avoid heavy   lifting.  Do not exercise in extreme heat or humidity, or at high altitudes.  Wear low-heel, comfortable shoes.  Practice good posture.  You may continue to have sex unless your health care provider tells you otherwise. Relieving pain and discomfort  Take frequent breaks and rest with your legs elevated if you have leg cramps or low back pain.  Take warm sitz baths to soothe any pain or discomfort caused by hemorrhoids. Use hemorrhoid cream if your health care provider approves.  Wear a good support bra to prevent discomfort from breast tenderness.  If you develop varicose veins: ? Wear support pantyhose or compression stockings as told by your healthcare provider. ? Elevate your feet for 15 minutes, 3-4 times a day. Prenatal care  Write down your questions. Take them to your prenatal visits.  Keep all your prenatal visits as told by your health care provider. This is important. Safety  Wear your seat belt at  all times when driving.  Make a list of emergency phone numbers, including numbers for family, friends, the hospital, and police and fire departments. General instructions  Avoid cat litter boxes and soil used by cats. These carry germs that can cause birth defects in the baby. If you have a cat, ask someone to clean the litter box for you.  Do not travel far distances unless it is absolutely necessary and only with the approval of your health care provider.  Do not use hot tubs, steam rooms, or saunas.  Do not drink alcohol.  Do not use any products that contain nicotine or tobacco, such as cigarettes and e-cigarettes. If you need help quitting, ask your health care provider.  Do not use any medicinal herbs or unprescribed drugs. These chemicals affect the formation and growth of the baby.  Do not douche or use tampons or scented sanitary pads.  Do not cross your legs for long periods of time.  To prepare for the arrival of your baby: ? Take prenatal classes to understand, practice, and ask questions about labor and delivery. ? Make a trial run to the hospital. ? Visit the hospital and tour the maternity area. ? Arrange for maternity or paternity leave through employers. ? Arrange for family and friends to take care of pets while you are in the hospital. ? Purchase a rear-facing car seat and make sure you know how to install it in your car. ? Pack your hospital bag. ? Prepare the baby's nursery. Make sure to remove all pillows and stuffed animals from the baby's crib to prevent suffocation.  Visit your dentist if you have not gone during your pregnancy. Use a soft toothbrush to brush your teeth and be gentle when you floss. Contact a health care provider if:  You are unsure if you are in labor or if your water has broken.  You become dizzy.  You have mild pelvic cramps, pelvic pressure, or nagging pain in your abdominal area.  You have lower back pain.  You have persistent  nausea, vomiting, or diarrhea.  You have an unusual or bad smelling vaginal discharge.  You have pain when you urinate. Get help right away if:  Your water breaks before 37 weeks.  You have regular contractions less than 5 minutes apart before 37 weeks.  You have a fever.  You are leaking fluid from your vagina.  You have spotting or bleeding from your vagina.  You have severe abdominal pain or cramping.  You have rapid weight loss or weight gain.    You have shortness of breath with chest pain.  You notice sudden or extreme swelling of your face, hands, ankles, feet, or legs.  Your baby makes fewer than 10 movements in 2 hours.  You have severe headaches that do not go away when you take medicine.  You have vision changes. Summary  The third trimester is from week 28 through week 40, months 7 through 9. The third trimester is a time when the unborn baby (fetus) is growing rapidly.  During the third trimester, your discomfort may increase as you and your baby continue to gain weight. You may have abdominal, leg, and back pain, sleeping problems, and an increased need to urinate.  During the third trimester your breasts will keep growing and they will continue to become tender. A yellow fluid (colostrum) may leak from your breasts. This is the first milk you are producing for your baby.  False labor is a condition in which you feel small, irregular tightenings of the muscles in the womb (contractions) that eventually go away. These are called Braxton Hicks contractions. Contractions may last for hours, days, or even weeks before true labor sets in.  Signs of labor can include: abdominal cramps; regular contractions that start at 10 minutes apart and become stronger and more frequent with time; watery or bloody mucus discharge that comes from the vagina; increased pelvic pressure and dull back pain; and leaking of amniotic fluid. This information is not intended to replace advice  given to you by your health care provider. Make sure you discuss any questions you have with your health care provider. Document Released: 09/21/2001 Document Revised: 03/04/2016 Document Reviewed: 11/28/2012 [employer] Interactive Patient Education  2017 [employer]  

## 2018-05-31 ENCOUNTER — Ambulatory Visit (HOSPITAL_COMMUNITY): Payer: BLUE CROSS/BLUE SHIELD

## 2018-06-09 ENCOUNTER — Other Ambulatory Visit (HOSPITAL_COMMUNITY): Payer: Self-pay | Admitting: Obstetrics and Gynecology

## 2018-06-09 ENCOUNTER — Other Ambulatory Visit (HOSPITAL_COMMUNITY): Payer: Self-pay | Admitting: *Deleted

## 2018-06-09 ENCOUNTER — Ambulatory Visit (HOSPITAL_COMMUNITY)
Admission: RE | Admit: 2018-06-09 | Discharge: 2018-06-09 | Disposition: A | Discharge status: Home / Self Care | Payer: BLUE CROSS/BLUE SHIELD | Source: Ambulatory Visit | Attending: Obstetrics and Gynecology | Admitting: Obstetrics and Gynecology

## 2018-06-09 ENCOUNTER — Encounter (HOSPITAL_COMMUNITY): Payer: Self-pay

## 2018-06-09 ENCOUNTER — Encounter: Payer: BLUE CROSS/BLUE SHIELD | Admitting: Obstetrics and Gynecology

## 2018-06-09 DIAGNOSIS — O321XX0 Maternal care for breech presentation, not applicable or unspecified: Secondary | ICD-10-CM | POA: Diagnosis not present

## 2018-06-09 DIAGNOSIS — O34593 Maternal care for other abnormalities of gravid uterus, third trimester: Secondary | ICD-10-CM | POA: Diagnosis not present

## 2018-06-09 DIAGNOSIS — Z3A32 32 weeks gestation of pregnancy: Secondary | ICD-10-CM | POA: Diagnosis not present

## 2018-06-09 DIAGNOSIS — O365930 Maternal care for other known or suspected poor fetal growth, third trimester, not applicable or unspecified: Secondary | ICD-10-CM

## 2018-06-09 DIAGNOSIS — Z362 Encounter for other antenatal screening follow-up: Secondary | ICD-10-CM | POA: Insufficient documentation

## 2018-06-09 DIAGNOSIS — O289 Unspecified abnormal findings on antenatal screening of mother: Secondary | ICD-10-CM | POA: Diagnosis present

## 2018-06-09 DIAGNOSIS — Z843 Family history of consanguinity: Secondary | ICD-10-CM | POA: Diagnosis not present

## 2018-06-09 DIAGNOSIS — Q513 Bicornate uterus: Secondary | ICD-10-CM | POA: Insufficient documentation

## 2018-06-09 DIAGNOSIS — O365990 Maternal care for other known or suspected poor fetal growth, unspecified trimester, not applicable or unspecified: Secondary | ICD-10-CM

## 2018-06-09 NOTE — Procedures (Signed)
Sara Pineda November 11, 1985 [redacted]w[redacted]d  Fetus A Non-Stress Test Interpretation for 06/09/18  Indication: IUGR  Fetal Heart Rate A Mode: External Baseline Rate (A): 135 bpm Variability: Moderate Accelerations: 15 x 15 Decelerations: None Multiple birth?: No  Uterine Activity Mode: Toco Contraction Frequency (min): 2-4 ireggular Contraction Duration (sec): 40-80 Contraction Quality: Mild Resting Tone Palpated: Relaxed Resting Time: Adequate  Interpretation (Fetal Testing) Nonstress Test Interpretation: Reactive Comments: FHR tracing rev'd by Dr. Donalee Citrin

## 2018-06-13 ENCOUNTER — Ambulatory Visit (HOSPITAL_COMMUNITY)
Admission: RE | Admit: 2018-06-13 | Discharge: 2018-06-13 | Disposition: A | Discharge status: Home / Self Care | Payer: BLUE CROSS/BLUE SHIELD | Source: Ambulatory Visit | Attending: Obstetrics and Gynecology | Admitting: Obstetrics and Gynecology

## 2018-06-13 ENCOUNTER — Other Ambulatory Visit (HOSPITAL_COMMUNITY): Payer: Self-pay | Admitting: Obstetrics and Gynecology

## 2018-06-13 ENCOUNTER — Encounter (HOSPITAL_COMMUNITY): Payer: Self-pay

## 2018-06-13 DIAGNOSIS — Z3A33 33 weeks gestation of pregnancy: Secondary | ICD-10-CM

## 2018-06-13 DIAGNOSIS — Z3A34 34 weeks gestation of pregnancy: Secondary | ICD-10-CM | POA: Insufficient documentation

## 2018-06-13 DIAGNOSIS — O365930 Maternal care for other known or suspected poor fetal growth, third trimester, not applicable or unspecified: Secondary | ICD-10-CM | POA: Diagnosis not present

## 2018-06-13 DIAGNOSIS — O365990 Maternal care for other known or suspected poor fetal growth, unspecified trimester, not applicable or unspecified: Secondary | ICD-10-CM

## 2018-06-13 DIAGNOSIS — Q512 Other doubling of uterus, unspecified: Principal | ICD-10-CM

## 2018-06-13 DIAGNOSIS — O34593 Maternal care for other abnormalities of gravid uterus, third trimester: Secondary | ICD-10-CM | POA: Diagnosis not present

## 2018-06-13 DIAGNOSIS — Q5128 Other doubling of uterus, other specified: Secondary | ICD-10-CM

## 2018-06-13 DIAGNOSIS — O3401 Maternal care for unspecified congenital malformation of uterus, first trimester: Secondary | ICD-10-CM

## 2018-06-13 MED ORDER — BETAMETHASONE SOD PHOS & ACET 6 (3-3) MG/ML IJ SUSP
12.0000 mg | Freq: Once | INTRAMUSCULAR | Status: AC
  Administered 2018-06-13: 12 mg via INTRAMUSCULAR
  Filled 2018-06-13: qty 2

## 2018-06-13 NOTE — Procedures (Signed)
Sara Pineda August 26, 1986 [redacted]w[redacted]d  Fetus A Non-Stress Test Interpretation for 06/13/18  Indication: IUGR  Fetal Heart Rate A Mode: External Baseline Rate (A): 135 bpm Variability: Moderate Accelerations: 15 x 15 Decelerations: None Multiple birth?: No  Uterine Activity Mode: Palpation, Toco Contraction Frequency (min): none  Interpretation (Fetal Testing) Nonstress Test Interpretation: Reactive Comments: FHR tracing reviewed with Dr. Donalee Citrin

## 2018-06-14 ENCOUNTER — Other Ambulatory Visit (HOSPITAL_COMMUNITY): Payer: Self-pay | Admitting: *Deleted

## 2018-06-14 ENCOUNTER — Ambulatory Visit (HOSPITAL_COMMUNITY)
Admission: RE | Admit: 2018-06-14 | Discharge: 2018-06-14 | Disposition: A | Discharge status: Home / Self Care | Payer: BLUE CROSS/BLUE SHIELD | Source: Ambulatory Visit | Attending: Obstetrics and Gynecology | Admitting: Obstetrics and Gynecology

## 2018-06-14 DIAGNOSIS — O365930 Maternal care for other known or suspected poor fetal growth, third trimester, not applicable or unspecified: Secondary | ICD-10-CM

## 2018-06-14 DIAGNOSIS — O365990 Maternal care for other known or suspected poor fetal growth, unspecified trimester, not applicable or unspecified: Secondary | ICD-10-CM | POA: Insufficient documentation

## 2018-06-14 DIAGNOSIS — Z3A00 Weeks of gestation of pregnancy not specified: Secondary | ICD-10-CM | POA: Diagnosis not present

## 2018-06-14 MED ORDER — BETAMETHASONE SOD PHOS & ACET 6 (3-3) MG/ML IJ SUSP
12.0000 mg | Freq: Once | INTRAMUSCULAR | Status: AC
  Administered 2018-06-14: 12 mg via INTRAMUSCULAR
  Filled 2018-06-14: qty 2

## 2018-06-15 ENCOUNTER — Ambulatory Visit (INDEPENDENT_AMBULATORY_CARE_PROVIDER_SITE_OTHER): Payer: BLUE CROSS/BLUE SHIELD | Admitting: Obstetrics and Gynecology

## 2018-06-15 ENCOUNTER — Encounter: Payer: Self-pay | Admitting: Obstetrics and Gynecology

## 2018-06-15 VITALS — BP 117/69 | HR 74 | Wt 147.6 lb

## 2018-06-15 DIAGNOSIS — O3402 Maternal care for unspecified congenital malformation of uterus, second trimester: Secondary | ICD-10-CM

## 2018-06-15 DIAGNOSIS — O365990 Maternal care for other known or suspected poor fetal growth, unspecified trimester, not applicable or unspecified: Secondary | ICD-10-CM | POA: Insufficient documentation

## 2018-06-15 DIAGNOSIS — Z3480 Encounter for supervision of other normal pregnancy, unspecified trimester: Secondary | ICD-10-CM

## 2018-06-15 DIAGNOSIS — O365930 Maternal care for other known or suspected poor fetal growth, third trimester, not applicable or unspecified: Secondary | ICD-10-CM

## 2018-06-15 HISTORY — DX: Maternal care for other known or suspected poor fetal growth, unspecified trimester, not applicable or unspecified: O36.5990

## 2018-06-15 NOTE — Patient Instructions (Addendum)
Childbirth Education Options: [facility] Classes:  Childbirth education classes can help you get ready for a positive parenting experience. You can also meet other expectant parents and get free stuff for your baby. Each class runs for five weeks on the same night and costs $45 for the mother-to-be and her support person. Medicaid covers the cost if you are eligible. Call [phone] to register. [facility] Childbirth Education:  [phone] or [phone] or [email or web address]  Baby & Me Class: Discuss newborn & infant parenting and family adjustment issues with other new mothers in a relaxed environment. Each week brings a new speaker or baby-centered activity. We encourage new mothers to join us every Thursday at 11:00am. Babies birth until crawling. No registration or fee. Daddy Boot Camp: This course offers Dads-to-be the tools and knowledge needed to feel confident on their journey to becoming new fathers. Experienced dads, who have been trained as coaches, teach dads-to-be how to hold, comfort, diaper, swaddle and play with their infant while being able to support the new mom as well. A class for men taught by men. $25/dad Big Brother/Big Sister: Let your children share in the joy of a new brother or sister in this special class designed just for them. Class includes discussion about how families care for babies: swaddling, holding, diapering, safety as well as how they can be helpful in their new role. This class is designed for children ages 2 to 6, but any age is welcome. Please register each child individually. $5/child  Mom Talk: This mom-led group offers support and connection to mothers as they journey through the adjustments and struggles of that sometimes overwhelming first year after the birth of a child. Tuesdays at 10:00am and Thursdays at 6:00pm. Babies welcome. No registration or fee. Breastfeeding Support Group: This group is a mother-to-mother  support circle where moms have the opportunity to share their breastfeeding experiences. A Lactation Consultant is present for questions and concerns. Meets each Tuesday at 11:00am. No fee or registration. Breastfeeding Your Baby: Learn what to expect in the first days of breastfeeding your newborn.  This class will help you feel more confident with the skills needed to begin your breastfeeding experience. Many new mothers are concerned about breastfeeding after leaving the hospital. This class will also address the most common fears and challenges about breastfeeding during the first few weeks, months and beyond. (call for fee) Comfort Techniques and Tour: This 2 hour interactive class will provide you the opportunity to learn & practice hands-on techniques that can help relieve some of the discomfort of labor and encourage your baby to rotate toward the best position for birth. You and your partner will be able to try a variety of labor positions with birth balls and rebozos as well as practice breathing, relaxation, and visualization techniques. A tour of the [facility] is included with this class. $20 per registrant and support person Childbirth Class- Weekend Option: This class is a Weekend version of our Birth & Baby series. It is designed for parents who have a difficult time fitting several weeks of classes into their schedule. It covers the care of your newborn and the basics of labor and childbirth. It also includes a Maternity Care Center Tour of [facility] and lunch. The class is held two consecutive days: beginning on Friday evening from 6:30 - 8:30 p.m. and the next day, Saturday from 9 a.m. - 4 p.m. (call for fee) Waterbirth Class: Interested in a waterbirth?  This   informational class will help you discover whether waterbirth is the right fit for you. Education about waterbirth itself, supplies you would need and how to assemble your support team is what you can  expect from this class. Some obstetrical practices require this class in order to pursue a waterbirth. (Not all obstetrical practices offer waterbirth-check with your healthcare provider.) Register only the expectant mom, but you are encouraged to bring your partner to class! Required if planning waterbirth, no fee. Infant/Child CPR: Parents, grandparents, babysitters, and friends learn Cardio-Pulmonary Resuscitation skills for infants and children. You will also learn how to treat both conscious and unconscious choking in infants and children. This Family & Friends program does not offer certification. Register each participant individually to ensure that enough mannequins are available. (Call for fee) Grandparent Love: Expecting a grandbaby? This class is for you! Learn about the latest infant care and safety recommendations and ways to support your own child as he or she transitions into the parenting role. Taught by Registered Nurses who are childbirth instructors, but most importantly...they are grandmothers too! $10/person. Childbirth Class- Natural Childbirth: This series of 5 weekly classes is for expectant parents who want to learn and practice natural methods of coping with the process of labor and childbirth. Relaxation, breathing, massage, visualization, role of the partner, and helpful positioning are highlighted. Participants learn how to be confident in their body's ability to give birth. This class will empower and help parents make informed decisions about their own care. Includes discussion that will help new parents transition into the immediate postpartum period. Maternity Care Center Tour of [facility] is included. We suggest taking this class between 25-32 weeks, but it's only a recommendation. $75 per registrant and one support person or $30 Medicaid. Childbirth Class- 3 week Series: This option of 3 weekly classes helps you and your labor partner prepare for childbirth. Newborn  care, labor & birth, cesarean birth, pain management, and comfort techniques are discussed and a Maternity Care Center Tour of [facility] is included. The class meets at the same time, on the same day of the week for 3 consecutive weeks beginning with the starting date you choose. $60 for registrant and one support person.  Marvelous Multiples: Expecting twins, triplets, or more? This class covers the differences in labor, birth, parenting, and breastfeeding issues that face multiples' parents. NICU tour is included. Led by a Certified Childbirth Educator who is the mother of twins. No fee. Caring for Baby: This class is for expectant and adoptive parents who want to learn and practice the most up-to-date newborn care for their babies. Focus is on birth through the first six weeks of life. Topics include feeding, bathing, diapering, crying, umbilical cord care, circumcision care and safe sleep. Parents learn to recognize symptoms of illness and when to call the pediatrician. Register only the mom-to-be and your partner or support person can plan to come with you! $10 per registrant and support person Childbirth Class- online option: This online class offers you the freedom to complete a Birth and Baby series in the comfort of your own home. The flexibility of this option allows you to review sections at your own pace, at times convenient to you and your support people. It includes additional video information, animations, quizzes, and extended activities. Get organized with helpful eClass tools, checklists, and trackers. Once you register online for the class, you will receive an email within a few days to accept the invitation and begin the class when the time   is right for you. The content will be available to you for 60 days. $60 for 60 days of online access for you and your support people.  Local Doulas: Natural Baby Doulas [email or web address] Tel:  [phone] [email or web address] [facility] [phone] [email or web address] The Labor Ladies  (also do waterbirth tub rental) [phone] [email or web address] [email or web address] [phone] [email or web address] Sacred Rhythms  [phone] [email or web address] [place] [facility] [employer] (PADA) [email or web address] http://www.padanc.org/index.htm [place] [facility]  [email or web address] Considering Waterbirth? Guide for patients at [facility]  Why consider waterbirth?  . Gentle birth for babies . Less pain medicine used in labor . May allow for passive descent/less pushing . May reduce perineal tears  . More mobility and instinctive maternal position changes . Increased maternal relaxation . Reduced blood pressure in labor  Is waterbirth safe? What are the risks of infection, drowning or other complications?  . Infection: o Very low risk (3.7 % for tub vs 4.8% for bed) o 7 in 8000 waterbirths with documented infection o Poorly cleaned equipment most common cause o Slightly lower group B strep transmission rate  . Drowning o Maternal:  - Very low risk   - Related to seizures or fainting o Newborn:  - Very low risk. No evidence of increased risk of respiratory problems in multiple large studies - Physiological protection from breathing under water - Avoid underwater birth if there are any fetal complications - Once baby's head is out of the water, keep it out.  . Birth complication o Some reports of cord trauma, but risk decreased by bringing baby to surface gradually o No evidence of increased risk of shoulder dystocia. Mothers can usually change positions faster in water than in a bed, possibly aiding the maneuvers to free the shoulder.   You must attend a Waterbirth class at [facility]  3rd Wednesday of every month from 7-9pm  Free  Register by calling [phone] or online at [email or web address]  Bring us the certificate from the class to your prenatal appointment  Meet with a midwife at 36 weeks to see if you can still plan a waterbirth and to sign the consent.   Purchase or rent the following supplies:   Water Birth Pool (Birth Pool in a Box or [place] for instance)  (Tubs start ~$125)  Single-use disposable tub liner designed for your brand of tub  New garden hose labeled "lead-free", "suitable for drinking water",  Electric drain pump to remove water (We recommend 792 gallon per hour or greater pump.)   Separate garden hose to remove the dirty water  Fish net  Bathing suit top (optional)  Long-handled mirror (optional)  Places to purchase or rent supplies  [email or web address] for tub purchases and supplies  Waterbirthsolutions.com for tub purchases and supplies  The Labor Ladies (www.[email or web address]) $275 for tub rental/set-up & take down/kit   [facility] [employer] (http://www.padanc.org/MeetUs.htm) Information regarding doulas (labor support) who provide pool rentals  Our practice has a Birth Pool in a Box tub at the hospital that you may borrow on a first-come-first-served basis. It is your responsibility to to set up, clean and break down the tub. We cannot guarantee the availability of this tub in advance. You are responsible for bringing all accessories listed above. If you do not have all necessary supplies you cannot have a waterbirth.    Things that would prevent you from having a waterbirth:  Premature, <37wks  Previous cesarean birth  Presence of thick meconium-stained fluid  Multiple gestation (Twins,   triplets, etc.)  Uncontrolled diabetes or gestational diabetes requiring medication  Hypertension requiring medication or diagnosis of pre-eclampsia  Heavy vaginal bleeding  Non-reassuring fetal  heart rate  Active infection (MRSA, etc.). Group B Strep is NOT a contraindication for  waterbirth.  If your labor has to be induced and induction method requires continuous  monitoring of the baby's heart rate  Other risks/issues identified by your obstetrical provider  Please remember that birth is unpredictable. Under certain unforeseeable circumstances your provider may advise against giving birth in the tub. These decisions will be made on a case-by-case basis and with the safety of you and your baby as our highest priority.   AREA PEDIATRIC/FAMILY PRACTICE PHYSICIANS  [facility] [address], [address] [place], [place]  [address] Phone - [phone]   Fax - [phone]  [facility] [address] [place], [place] [address] Phone - [phone]   Fax - [phone]  [facility] AM[facility] [address] [place], [place]  [address] Phone - [phone]   Fax - [phone]  [facility] [address] [place], [place]  [address] Phone - [phone]   Fax - [phone]  [facility] [address] [place], [place]  [address] Phone - [phone]   Fax - [phone]  [facility] [address] [place], [place]  [address] Phone - [phone]   Fax - [phone]  [facility] [address] [place], [place]  [address] Phone - [phone]   Fax - [phone]  [facility] [address] [place], [place]  [address] Phone - [phone]   Fax - [phone]  [facility] [address] [place], [place]  [address] Phone - [phone]   Fax - [phone] [facility] [address] [place], [place]  [address] Phone - [phone]   Fax - [phone]  [facility] [address] [place], [place]  [address] Phone -  [phone]   Fax - [phone]  [facility] [address] [place], [place]  [address] Phone - [phone]   Fax - [phone]  [facility] [address] [place], [place]  [address] Phone - [phone]   Fax - [phone]  SHILPA GOSRANI [address] [place], [place]  [address] Phone - [phone]  [facility] [address] [place], [place]  [address] Phone - [phone]   Fax - [phone]  [facility] [address] [place], [place]  [address] Phone - [phone]   Fax - [phone]  [facility] [address] [place] [address] [place], [place]  [address] Phone - [phone]   Fax - [phone]  [facility] [address] [place], [place]  [address] Phone - [phone]   Fax - [phone]   [facility] [address] [place], [place]  [address] Phone - [phone]   Fax - [phone]  [facility] [address] [place], [place]  [address] Phone - [phone]   Fax - [phone]  DAVID RUBIN [address] [place], [place]  [address] Phone - [phone]   Fax - [phone]  [facility] [address] [place], [place]  [address] Phone - [phone]   Fax - [phone]  [facility] [address] [place], [place]  [address] Phone - [phone]   Fax - [phone] [facility] [address] [place], [place]  [address] Phone - [phone]   Fax - [phone]  [facility] [address] [place], [place]  [address] Phone - [phone]   Fax - [phone]  [facility] [address] , [place]  [address] Phone - [phone]   Fax - [phone]  [facility] Charlene Flemming MD [address]  [place] [address] Phone [phone]  Fax [phone]     

## 2018-06-15 NOTE — Progress Notes (Signed)
Declines flu shot

## 2018-06-15 NOTE — Progress Notes (Signed)
Subjective:  Sara Pineda is a 32 y.o. G2P0010 at [redacted]w[redacted]d being seen today for ongoing prenatal care.  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  Patient reports general discomforts of pregnancy.  Contractions: Irregular. Vag. Bleeding: None.  Movement: Present. Denies leaking of fluid.   The following portions of the patient's history were reviewed and updated as appropriate: allergies, current medications, past family history, past medical history, past social history, past surgical history and problem list. Problem list updated.  Objective:   Vitals:   06/15/18 1104  BP: 117/69  Pulse: 74  Weight: 147 lb 9.6 oz (67 kg)    Fetal Status: Fetal Heart Rate (bpm): 127   Movement: Present     General:  Alert, oriented and cooperative. Patient is in no acute distress.  Skin: Skin is warm and dry. No rash noted.   Cardiovascular: Normal heart rate noted  Respiratory: Normal respiratory effort, no problems with respiration noted  Abdomen: Soft, gravid, appropriate for gestational age. Pain/Pressure: Present     Pelvic:  Cervical exam deferred        Extremities: Normal range of motion.  Edema: Trace  Mental Status: Normal mood and affect. Normal behavior. Normal judgment and thought content.   Urinalysis:      Assessment and Plan:  Pregnancy: G2P0010 at [redacted]w[redacted]d  1. Supervision of other normal pregnancy, antepartum Stable  2. 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 Poor fetal growth affecting management of mother in third trimester, single or  unspecified fetus S/D ratio increased BMZ x 2 given Twice weekly BPP/NST and UA Dopplers with MFM   Preterm labor symptoms and general obstetric precautions including but not limited to vaginal bleeding, contractions, leaking of fluid and fetal movement were reviewed in detail with the patient. Please refer to After Visit Summary for other counseling recommendations.  Return in about 1 week (around 06/22/2018) for OB visit.   Chancy Milroy, MD

## 2018-06-16 ENCOUNTER — Encounter (HOSPITAL_COMMUNITY): Payer: Self-pay

## 2018-06-16 ENCOUNTER — Ambulatory Visit (HOSPITAL_BASED_OUTPATIENT_CLINIC_OR_DEPARTMENT_OTHER)
Admission: RE | Admit: 2018-06-16 | Discharge: 2018-06-16 | Disposition: A | Discharge status: Home / Self Care | Payer: BLUE CROSS/BLUE SHIELD | Source: Ambulatory Visit | Attending: Obstetrics and Gynecology | Admitting: Obstetrics and Gynecology

## 2018-06-16 ENCOUNTER — Inpatient Hospital Stay (HOSPITAL_COMMUNITY)
Admission: AD | Admit: 2018-06-16 | Discharge: 2018-06-16 | Disposition: A | Discharge status: Home / Self Care | Payer: BLUE CROSS/BLUE SHIELD | Source: Ambulatory Visit | Attending: Obstetrics and Gynecology | Admitting: Obstetrics and Gynecology

## 2018-06-16 ENCOUNTER — Other Ambulatory Visit (HOSPITAL_COMMUNITY): Payer: Self-pay | Admitting: *Deleted

## 2018-06-16 ENCOUNTER — Ambulatory Visit (HOSPITAL_COMMUNITY)
Admission: RE | Admit: 2018-06-16 | Discharge: 2018-06-16 | Disposition: A | Discharge status: Home / Self Care | Payer: BLUE CROSS/BLUE SHIELD | Source: Ambulatory Visit | Attending: Obstetrics and Gynecology | Admitting: Obstetrics and Gynecology

## 2018-06-16 DIAGNOSIS — Z3A33 33 weeks gestation of pregnancy: Secondary | ICD-10-CM

## 2018-06-16 DIAGNOSIS — O34593 Maternal care for other abnormalities of gravid uterus, third trimester: Secondary | ICD-10-CM | POA: Diagnosis not present

## 2018-06-16 DIAGNOSIS — O365930 Maternal care for other known or suspected poor fetal growth, third trimester, not applicable or unspecified: Secondary | ICD-10-CM

## 2018-06-16 DIAGNOSIS — O368330 Maternal care for abnormalities of the fetal heart rate or rhythm, third trimester, not applicable or unspecified: Secondary | ICD-10-CM

## 2018-06-16 DIAGNOSIS — O99613 Diseases of the digestive system complicating pregnancy, third trimester: Secondary | ICD-10-CM | POA: Insufficient documentation

## 2018-06-16 DIAGNOSIS — O368390 Maternal care for abnormalities of the fetal heart rate or rhythm, unspecified trimester, not applicable or unspecified: Secondary | ICD-10-CM

## 2018-06-16 DIAGNOSIS — Z3689 Encounter for other specified antenatal screening: Secondary | ICD-10-CM

## 2018-06-16 DIAGNOSIS — O365990 Maternal care for other known or suspected poor fetal growth, unspecified trimester, not applicable or unspecified: Secondary | ICD-10-CM

## 2018-06-16 DIAGNOSIS — Z8489 Family history of other specified conditions: Secondary | ICD-10-CM | POA: Diagnosis not present

## 2018-06-16 DIAGNOSIS — K2100 Gastro-esophageal reflux disease with esophagitis, without bleeding: Secondary | ICD-10-CM

## 2018-06-16 MED ORDER — RANITIDINE HCL 150 MG PO TABS: 150.0000 mg | ORAL_TABLET | Freq: Two times a day (BID) | ORAL | 0 refills | Status: DC

## 2018-06-16 NOTE — MAU Provider Note (Signed)
Chief Complaint:  Non-stress Test   First Provider Initiated Contact with Patient 06/16/18 1409      HPI: Sara Pineda is a 32 y.o. G2P0010 at [redacted]w[redacted]d who presents to maternity admissions sent from MFM with decel on NST.  She also reports an episode of chest pain during the night last night, substernal, that kept her awake 2-3 hours. It resolved and she denies pain now. She ate dinner late last night, at a barbeque at someone's house.  She denies any other symptoms. She is feeling normal fetal movement. She has not tried any treatments. She reports good fetal movement, denies LOF, vaginal bleeding, vaginal itching/burning, urinary symptoms, h/a, dizziness, n/v, or fever/chills.    HPI  Past Medical History: Past Medical History:  Diagnosis Date  . Medical history non-contributory     Past obstetric history: OB History  Gravida Para Term Preterm AB Living  2       1 0  SAB TAB Ectopic Multiple Live Births  1            # Outcome Date GA Lbr Len/2nd Weight Sex Delivery Anes PTL Lv  2 Current           1 SAB 12/2015            Past Surgical History: Past Surgical History:  Procedure Laterality Date  . DILATION AND EVACUATION N/A 01/06/2016   Procedure: DILATATION AND EVACUATION with Ultrasound Guidance;  Surgeon: Thurnell Lose, MD;  Location: [facility];  Service: Gynecology;  Laterality: N/A;    Family History: No family history on file.  Social History: Social History   Tobacco Use  . Smoking status: Never Smoker  . Smokeless tobacco: Never Used  Substance Use Topics  . Alcohol use: No  . Drug use: No    Allergies: No Known Allergies  Meds:  Medications Prior to Admission  Medication Sig Dispense Refill Last Dose  . acetaminophen (TYLENOL) 325 MG tablet Take 325 mg by mouth every 6 (six) hours as needed for mild pain or headache.    Past Week at Unknown time  . Prenatal Vit-Fe Fumarate-FA (PRENATAL MULTIVITAMIN) TABS tablet Take 1 tablet by mouth daily at 12 noon.    Past Week at Unknown time    ROS:  Review of Systems  Constitutional: Negative for chills, fatigue and fever.  Eyes: Negative for visual disturbance.  Respiratory: Negative for shortness of breath.   Cardiovascular: Positive for chest pain.  Gastrointestinal: Negative for abdominal pain, nausea and vomiting.  Genitourinary: Negative for difficulty urinating, dysuria, flank pain, pelvic pain, vaginal bleeding, vaginal discharge and vaginal pain.  Neurological: Negative for dizziness and headaches.  Psychiatric/Behavioral: Negative.      I have reviewed patient's Past Medical Hx, Surgical Hx, Family Hx, Social Hx, medications and allergies.   Physical Exam   Patient Vitals for the past 24 hrs:  BP Temp Pulse Resp SpO2 Height Weight  06/16/18 1256 134/84 98.3 F (36.8 C) 88 16 97 % 5' 1.5" (1.562 m) 68 kg   Constitutional: Well-developed, well-nourished female in no acute distress.  Cardiovascular: normal rate Respiratory: normal effort GI: Abd soft, non-tender, gravid appropriate for gestational age.  MS: Extremities nontender, no edema, normal ROM Neurologic: Alert and oriented x 4.  GU: Neg CVAT.      FHT:  Baseline 135 , moderate variability, accelerations present, no decelerations Contractions: 1-2 in 1 hour, mild to palpation   Labs: No results found for this or any previous visit (from  the past 24 hour(s)). A/Positive/-- (04/02 0954)  Imaging:  [place] Mfm Fetal Bpp W/nonstress  Result Date: 06/16/2018 ----------------------------------------------------------------------  OBSTETRICS REPORT                    (Corrected Final 06/16/2018 12:29 pm) ---------------------------------------------------------------------- Patient Info  ID #:       [id number]                          D.O.B.:  1986-02-12 (32 yrs)  Name:       Sara Pineda                 Visit Date: 06/16/2018 10:21 am ---------------------------------------------------------------------- Performed By  Performed  By:     Rodrigo Ran BS      Ref. Address:     [address]                                                             [address]  Attending:        Lajoyce Lauber      Location:         [facility]                    MD  Referred By:      Sloan Leiter                    MD ---------------------------------------------------------------------- Orders   #  Description                          Code         Ordered By   1  [place] MFM FETAL BPP                     40981.1      RAVI [facility]      W/NONSTRESS   2  [place] MFM UA CORD DOPPLER               76820.02     [facility]  ----------------------------------------------------------------------   #  Order #                    Accession #                 Episode #   1  [id number]                  [id number]   2  [id number]                  [id number]                  [id number]  ---------------------------------------------------------------------- Indications   Maternal care for known or suspected poor      O36.5930   fetal growth, third trimester, not applicable or   unspecified   Bicornuate uterus, 3RD trimester  O53.593 Q51.3   Family history of genetic disorder (FOB        Z84.89   parents are cousins, both brothers are   mentally handicap)   [redacted] weeks gestation of pregnancy                Z3A.33  ---------------------------------------------------------------------- Vital Signs                                                 Height:        5'2" ---------------------------------------------------------------------- Fetal Evaluation  Num Of Fetuses:         1  Fetal Heart Rate(bpm):  135  Cardiac Activity:       Observed  Presentation:           Cephalic  Amniotic Fluid  AFI FV:      Within normal limits  AFI Sum(cm)     %Tile       Largest Pocket(cm)  14.01           48          4.07  RUQ(cm)       RLQ(cm)       LUQ(cm)        LLQ(cm)  4.03          2.53          3.38           4.07  ---------------------------------------------------------------------- Biophysical Evaluation  Amniotic F.V:   Within normal limits       F. Tone:        Observed  F. Movement:    Observed                   N.S.T:          Reactive  F. Breathing:   Observed                   Score:          10/10 ---------------------------------------------------------------------- OB History  Gravidity:    2         Term:   0         SAB:   1  Living:       0 ---------------------------------------------------------------------- Gestational Age  LMP:           34w 1d        Date:  10/20/17                 EDD:   07/27/18  Best:          33w 4d     Det. ByLoman Chroman         EDD:   07/31/18                                      (12/22/17) ---------------------------------------------------------------------- Doppler - Fetal Vessels  Umbilical Artery   S/D     %tile                                            ADFV    RDFV  5.63    > 97.5  No      No  Comment:    One 1 1/2 to 2 minute deceleration noted.  MD on call              contacted.  Patient to go to MAU for extended monitoring. ---------------------------------------------------------------------- Comments  U/S images reviewed. Findings reviewed with patient.   No  fetal abnormalities are seen.  No evidence of fetal  compromise is found on BPP today.  UA dopplers are  elevated.  Questions answered.  1 1/2-2 minute deceleration noted.  Discussed with MD/DO on call  10 minutes spent face tto face with patient.  Recommendations: 1)To MAU for extended monitoring 2)  Serial U/S every 2 weeks for fetal growth 3) Twice weekly  BPP/NST/UA dopplers  1 1/2-2 minute deceleration noted.  Discussed with MD/DO on call ---------------------------------------------------------------------- Recommendations  1) Serial U/S every 2 weeks for fetal growth 2) Twice weekly  BPP/NST/UA dopplers 2) If AEDF, recommend delivery @ 34  weeks 3) To MAU  for extended monitoring ----------------------------------------------------------------------                    Lajoyce Lauber, MD Electronically Signed Corrected Final Report  06/16/2018 12:29 pm ---------------------------------------------------------------------- [place] Mfm Ua Cord Doppler  Result Date: 06/16/2018 ----------------------------------------------------------------------  OBSTETRICS REPORT                    (Corrected Final 06/16/2018 12:29 pm) ---------------------------------------------------------------------- Patient Info  ID #:       [id number]                          D.O.B.:  [birth year] (32 yrs)  Name:       Sara Pineda                 Visit Date: 06/16/2018 10:21 am ---------------------------------------------------------------------- Performed By  Performed By:     Rodrigo Ran BS      Ref. Address:     [address]                                                             [address]  Attending:        Lajoyce Lauber      Location:         [facility]                    MD  Referred By:      Sloan Leiter                    MD ---------------------------------------------------------------------- Orders   #  Description                          Code         Ordered By   1  [place] MFM FETAL BPP                     13244.0      RAVI NUUVOZD      W/NONSTRESS   2  [place] MFM  UA CORD DOPPLER               76820.02     [facility]  ----------------------------------------------------------------------   #  Order #                    Accession #                 Episode #   1  [id number]                  [id number]                  [id number]   2  [id number]                  [id number]                  [id number]  ---------------------------------------------------------------------- Indications   Maternal care for known or suspected poor      O36.5930   fetal growth, third trimester, not applicable or   unspecified   Bicornuate uterus, 3RD trimester                O34.593 Q51.3   Family history of genetic disorder (FOB        Z84.89   parents are cousins, both brothers are   mentally handicap)   [redacted] weeks gestation of pregnancy                Z3A.33  ---------------------------------------------------------------------- Vital Signs                                                 Height:        5'2" ---------------------------------------------------------------------- Fetal Evaluation  Num Of Fetuses:         1  Fetal Heart Rate(bpm):  135  Cardiac Activity:       Observed  Presentation:           Cephalic  Amniotic Fluid  AFI FV:      Within normal limits  AFI Sum(cm)     %Tile       Largest Pocket(cm)  14.01           48          4.07  RUQ(cm)       RLQ(cm)       LUQ(cm)        LLQ(cm)  4.03          2.53          3.38           4.07 ---------------------------------------------------------------------- Biophysical Evaluation  Amniotic F.V:   Within normal limits       F. Tone:        Observed  F. Movement:    Observed                   N.S.T:          Reactive  F. Breathing:   Observed                   Score:          10/10 ---------------------------------------------------------------------- OB History  Gravidity:    2         Term:   0  SAB:   1  Living:       0 ---------------------------------------------------------------------- Gestational Age  LMP:           34w 1d        Date:  10/20/17                 EDD:   07/27/18  Best:          33w 4d     Det. By:  Loman Chroman         EDD:   07/31/18                                      (12/22/17) ---------------------------------------------------------------------- Doppler - Fetal Vessels  Umbilical Artery   S/D     %tile                                            ADFV    RDFV  5.63    > 97.5                                               No      No  Comment:    One 1 1/2 to 2 minute deceleration noted.  MD on call              contacted.  Patient to go to MAU for extended monitoring.  ---------------------------------------------------------------------- Comments  U/S images reviewed. Findings reviewed with patient.   No  fetal abnormalities are seen.  No evidence of fetal  compromise is found on BPP today.  UA dopplers are  elevated.  Questions answered.  1 1/2-2 minute deceleration noted.  Discussed with MD/DO on call  10 minutes spent face tto face with patient.  Recommendations: 1)To MAU for extended monitoring 2)  Serial U/S every 2 weeks for fetal growth 3) Twice weekly  BPP/NST/UA dopplers  1 1/2-2 minute deceleration noted.  Discussed with MD/DO on call ---------------------------------------------------------------------- Recommendations  1) Serial U/S every 2 weeks for fetal growth 2) Twice weekly  BPP/NST/UA dopplers 2) If AEDF, recommend delivery @ 34  weeks 3) To MAU for extended monitoring ----------------------------------------------------------------------                    Lajoyce Lauber, MD Electronically Signed Corrected Final Report  06/16/2018 12:29 pm ----------------------------------------------------------------------  MAU Course/MDM: NST reviewed and reactive x 2 hours in MAU BPP in MFM today 8/8 with normal dopplers Consult Dr Rosana Hoes with presentation, exam findings and test results.  Treat chest pain as likely acid reflux with Zantac 150 mg BID PRN Fetal kick counts reviewed, f/u as scheduled Pt discharge with strict return precautions.   Assessment:  1. Antepartum variable deceleration   2. NST (non-stress test) reactive   3. Gastroesophageal reflux disease with esophagitis   4. Poor fetal growth affecting management of mother in third trimester, single or unspecified fetus    Plan: Discharge home Labor precautions and fetal kick counts    Fatima Blank Certified Nurse-Midwife 06/16/2018 2:10 PM

## 2018-06-16 NOTE — ED Notes (Signed)
Report called to [facility], pt to MAU for fetal monitoring

## 2018-06-16 NOTE — Procedures (Signed)
Sara Pineda 21-Dec-1985 [redacted]w[redacted]d  Fetus A Non-Stress Test Interpretation for 06/16/18  Indication: IUGR  Fetal Heart Rate A Mode: External Baseline Rate (A): 135 bpm Variability: Moderate Accelerations: 15 x 15 Decelerations: (one decel noted lasting about 2 min) Multiple birth?: No  Uterine Activity Mode: Toco Contraction Frequency (min): none noted Contraction Quality: Mild Resting Tone Palpated: Relaxed Resting Time: Adequate  Interpretation (Fetal Testing) Nonstress Test Interpretation: Reactive Comments: FHR tracing rev'd by Dr. Yates Decamp.  Pt to MAU for extended monitoring

## 2018-06-16 NOTE — MAU Note (Signed)
Pt sent from MFM due to decel on NST.

## 2018-06-20 ENCOUNTER — Inpatient Hospital Stay (HOSPITAL_COMMUNITY): Payer: BLUE CROSS/BLUE SHIELD

## 2018-06-20 ENCOUNTER — Encounter (HOSPITAL_COMMUNITY): Payer: Self-pay

## 2018-06-20 ENCOUNTER — Inpatient Hospital Stay (HOSPITAL_COMMUNITY)
Admission: AD | Admit: 2018-06-20 | Discharge: 2018-06-23 | DRG: 788 | Disposition: A | Discharge status: Home / Self Care | Payer: BLUE CROSS/BLUE SHIELD | Attending: Family Medicine | Admitting: Family Medicine

## 2018-06-20 ENCOUNTER — Ambulatory Visit (HOSPITAL_COMMUNITY)
Admission: RE | Admit: 2018-06-20 | Discharge: 2018-06-20 | Disposition: A | Discharge status: Home / Self Care | Payer: BLUE CROSS/BLUE SHIELD | Source: Ambulatory Visit | Attending: Obstetrics and Gynecology | Admitting: Obstetrics and Gynecology

## 2018-06-20 ENCOUNTER — Encounter (HOSPITAL_COMMUNITY): Payer: Self-pay | Admitting: *Deleted

## 2018-06-20 ENCOUNTER — Ambulatory Visit (HOSPITAL_BASED_OUTPATIENT_CLINIC_OR_DEPARTMENT_OTHER)
Admission: RE | Admit: 2018-06-20 | Discharge: 2018-06-20 | Disposition: A | Discharge status: Home / Self Care | Payer: BLUE CROSS/BLUE SHIELD | Source: Ambulatory Visit | Attending: Obstetrics and Gynecology | Admitting: Obstetrics and Gynecology

## 2018-06-20 ENCOUNTER — Encounter (HOSPITAL_COMMUNITY)
Admission: AD | Disposition: A | Discharge status: Home / Self Care | Payer: Self-pay | Source: Home / Self Care | Attending: Family Medicine

## 2018-06-20 DIAGNOSIS — O365930 Maternal care for other known or suspected poor fetal growth, third trimester, not applicable or unspecified: Secondary | ICD-10-CM

## 2018-06-20 DIAGNOSIS — Q513 Bicornate uterus: Secondary | ICD-10-CM

## 2018-06-20 DIAGNOSIS — O34593 Maternal care for other abnormalities of gravid uterus, third trimester: Secondary | ICD-10-CM | POA: Diagnosis not present

## 2018-06-20 DIAGNOSIS — O3403 Maternal care for unspecified congenital malformation of uterus, third trimester: Secondary | ICD-10-CM | POA: Diagnosis present

## 2018-06-20 DIAGNOSIS — O3413 Maternal care for benign tumor of corpus uteri, third trimester: Secondary | ICD-10-CM | POA: Diagnosis present

## 2018-06-20 DIAGNOSIS — O321XX0 Maternal care for breech presentation, not applicable or unspecified: Secondary | ICD-10-CM | POA: Diagnosis present

## 2018-06-20 DIAGNOSIS — Z98891 History of uterine scar from previous surgery: Secondary | ICD-10-CM

## 2018-06-20 DIAGNOSIS — Z3A34 34 weeks gestation of pregnancy: Secondary | ICD-10-CM

## 2018-06-20 DIAGNOSIS — D252 Subserosal leiomyoma of uterus: Secondary | ICD-10-CM | POA: Diagnosis present

## 2018-06-20 LAB — CBC
HCT: 37.9 % (ref 36.0–46.0)
Hemoglobin: 12.9 g/dL (ref 12.0–15.0)
MCH: 32.7 pg (ref 26.0–34.0)
MCHC: 34 g/dL (ref 30.0–36.0)
MCV: 95.9 fL (ref 78.0–100.0)
PLATELETS: 286 10*3/uL (ref 150–400)
RBC: 3.95 MIL/uL (ref 3.87–5.11)
RDW: 12.7 % (ref 11.5–15.5)
WBC: 12.4 10*3/uL — AB (ref 4.0–10.5)

## 2018-06-20 LAB — RAPID HIV SCREEN (HIV 1/2 AB+AG)
HIV 1/2 ANTIBODIES: NONREACTIVE
HIV-1 P24 ANTIGEN - HIV24: NONREACTIVE

## 2018-06-20 LAB — TYPE AND SCREEN
ABO/RH(D): A POS
Antibody Screen: NEGATIVE

## 2018-06-20 SURGERY — Surgical Case
Anesthesia: Spinal

## 2018-06-20 MED ORDER — ONDANSETRON HCL 4 MG/2ML IJ SOLN
INTRAMUSCULAR | Status: AC
  Filled 2018-06-20: qty 2

## 2018-06-20 MED ORDER — METHYLERGONOVINE MALEATE 0.2 MG/ML IJ SOLN
INTRAMUSCULAR | Status: AC
  Filled 2018-06-20: qty 1

## 2018-06-20 MED ORDER — ZOLPIDEM TARTRATE 5 MG PO TABS: 5.0000 mg | ORAL_TABLET | Freq: Every evening | ORAL | Status: DC | PRN

## 2018-06-20 MED ORDER — CEFAZOLIN SODIUM-DEXTROSE 2-4 GM/100ML-% IV SOLN
2.0000 g | INTRAVENOUS | Status: AC
  Administered 2018-06-20: 2 g via INTRAVENOUS
  Filled 2018-06-20: qty 100

## 2018-06-20 MED ORDER — MORPHINE SULFATE (PF) 0.5 MG/ML IJ SOLN
INTRAMUSCULAR | Status: AC
  Filled 2018-06-20: qty 10

## 2018-06-20 MED ORDER — DIBUCAINE 1 % RE OINT: 1.0000 "application " | TOPICAL_OINTMENT | RECTAL | Status: DC | PRN

## 2018-06-20 MED ORDER — PRENATAL MULTIVITAMIN CH
1.0000 | ORAL_TABLET | Freq: Every day | ORAL | Status: DC
  Administered 2018-06-21 – 2018-06-23 (×3): 1 via ORAL
  Filled 2018-06-20 (×4): qty 1

## 2018-06-20 MED ORDER — BUPIVACAINE IN DEXTROSE 0.75-8.25 % IT SOLN
INTRATHECAL | Status: DC | PRN
  Administered 2018-06-20: 12 mg via INTRATHECAL

## 2018-06-20 MED ORDER — DIPHENHYDRAMINE HCL 25 MG PO CAPS
25.0000 mg | ORAL_CAPSULE | Freq: Four times a day (QID) | ORAL | Status: DC | PRN
  Administered 2018-06-21: 25 mg via ORAL
  Filled 2018-06-20: qty 1

## 2018-06-20 MED ORDER — PHENYLEPHRINE 8 MG IN D5W 100 ML (0.08MG/ML) PREMIX OPTIME
INJECTION | INTRAVENOUS | Status: DC | PRN
  Administered 2018-06-20: 70 ug/min via INTRAVENOUS

## 2018-06-20 MED ORDER — FENTANYL CITRATE (PF) 100 MCG/2ML IJ SOLN
INTRAMUSCULAR | Status: AC
  Filled 2018-06-20: qty 2

## 2018-06-20 MED ORDER — PHENYLEPHRINE 40 MCG/ML (10ML) SYRINGE FOR IV PUSH (FOR BLOOD PRESSURE SUPPORT)
PREFILLED_SYRINGE | INTRAVENOUS | Status: AC
  Filled 2018-06-20: qty 10

## 2018-06-20 MED ORDER — DEXAMETHASONE SODIUM PHOSPHATE 4 MG/ML IJ SOLN
INTRAMUSCULAR | Status: AC
  Filled 2018-06-20: qty 1

## 2018-06-20 MED ORDER — OXYCODONE-ACETAMINOPHEN 5-325 MG PO TABS: 1.0000 | ORAL_TABLET | ORAL | Status: DC | PRN

## 2018-06-20 MED ORDER — OXYTOCIN 10 UNIT/ML IJ SOLN
INTRAVENOUS | Status: DC | PRN
  Administered 2018-06-20: 40 [IU] via INTRAVENOUS

## 2018-06-20 MED ORDER — IBUPROFEN 600 MG PO TABS
600.0000 mg | ORAL_TABLET | Freq: Four times a day (QID) | ORAL | Status: DC
  Administered 2018-06-21 – 2018-06-23 (×10): 600 mg via ORAL
  Filled 2018-06-20 (×11): qty 1

## 2018-06-20 MED ORDER — LACTATED RINGERS IV SOLN
INTRAVENOUS | Status: DC | PRN
  Administered 2018-06-20: 18:00:00 via INTRAVENOUS

## 2018-06-20 MED ORDER — SIMETHICONE 80 MG PO CHEW: 80.0000 mg | CHEWABLE_TABLET | ORAL | Status: DC | PRN

## 2018-06-20 MED ORDER — LACTATED RINGERS IV SOLN
125.0000 mL/h | INTRAVENOUS | Status: DC
  Administered 2018-06-20: 125 mL/h via INTRAVENOUS
  Administered 2018-06-20 (×2): via INTRAVENOUS

## 2018-06-20 MED ORDER — SENNOSIDES-DOCUSATE SODIUM 8.6-50 MG PO TABS
2.0000 | ORAL_TABLET | ORAL | Status: DC
  Administered 2018-06-21 – 2018-06-22 (×3): 2 via ORAL
  Filled 2018-06-20 (×3): qty 2

## 2018-06-20 MED ORDER — ONDANSETRON HCL 4 MG/2ML IJ SOLN
INTRAMUSCULAR | Status: DC | PRN
  Administered 2018-06-20: 4 mg via INTRAVENOUS

## 2018-06-20 MED ORDER — COCONUT OIL OIL
1.0000 "application " | TOPICAL_OIL | Status: DC | PRN
  Administered 2018-06-21: 1 via TOPICAL
  Filled 2018-06-20: qty 120

## 2018-06-20 MED ORDER — TETANUS-DIPHTH-ACELL PERTUSSIS 5-2.5-18.5 LF-MCG/0.5 IM SUSP: 0.5000 mL | Freq: Once | INTRAMUSCULAR | Status: DC

## 2018-06-20 MED ORDER — OXYCODONE-ACETAMINOPHEN 5-325 MG PO TABS: 2.0000 | ORAL_TABLET | ORAL | Status: DC | PRN

## 2018-06-20 MED ORDER — MENTHOL 3 MG MT LOZG: 1.0000 | LOZENGE | OROMUCOSAL | Status: DC | PRN

## 2018-06-20 MED ORDER — MORPHINE SULFATE (PF) 0.5 MG/ML IJ SOLN
INTRAMUSCULAR | Status: DC | PRN
  Administered 2018-06-20: .15 mg via INTRATHECAL

## 2018-06-20 MED ORDER — DEXAMETHASONE SODIUM PHOSPHATE 4 MG/ML IJ SOLN
INTRAMUSCULAR | Status: DC | PRN
  Administered 2018-06-20: 4 mg via INTRAVENOUS

## 2018-06-20 MED ORDER — ACETAMINOPHEN 325 MG PO TABS: 650.0000 mg | ORAL_TABLET | ORAL | Status: DC | PRN

## 2018-06-20 MED ORDER — LACTATED RINGERS IV SOLN: INTRAVENOUS | Status: DC

## 2018-06-20 MED ORDER — OXYTOCIN 40 UNITS IN LACTATED RINGERS INFUSION - SIMPLE MED: 2.5000 [IU]/h | INTRAVENOUS | Status: AC

## 2018-06-20 MED ORDER — OXYTOCIN 10 UNIT/ML IJ SOLN
INTRAMUSCULAR | Status: AC
  Filled 2018-06-20: qty 4

## 2018-06-20 MED ORDER — SIMETHICONE 80 MG PO CHEW
80.0000 mg | CHEWABLE_TABLET | ORAL | Status: DC
  Administered 2018-06-21 – 2018-06-22 (×3): 80 mg via ORAL
  Filled 2018-06-20 (×3): qty 1

## 2018-06-20 MED ORDER — FENTANYL CITRATE (PF) 100 MCG/2ML IJ SOLN
INTRAMUSCULAR | Status: DC | PRN
  Administered 2018-06-20: 150 ug via INTRATHECAL

## 2018-06-20 MED ORDER — WITCH HAZEL-GLYCERIN EX PADS: 1.0000 "application " | MEDICATED_PAD | CUTANEOUS | Status: DC | PRN

## 2018-06-20 MED ORDER — PHENYLEPHRINE 8 MG IN D5W 100 ML (0.08MG/ML) PREMIX OPTIME
INJECTION | INTRAVENOUS | Status: AC
  Filled 2018-06-20: qty 100

## 2018-06-20 MED ORDER — SIMETHICONE 80 MG PO CHEW
80.0000 mg | CHEWABLE_TABLET | Freq: Three times a day (TID) | ORAL | Status: DC
  Administered 2018-06-21 – 2018-06-23 (×8): 80 mg via ORAL
  Filled 2018-06-20 (×8): qty 1

## 2018-06-20 SURGICAL SUPPLY — 34 items
BENZOIN TINCTURE PRP APPL 2/3 (GAUZE/BANDAGES/DRESSINGS) ×3 IMPLANT
CHLORAPREP W/TINT 26ML (MISCELLANEOUS) ×3 IMPLANT
CLAMP CORD UMBIL (MISCELLANEOUS) IMPLANT
CLOSURE STERI STRIP 1/2 X4 (GAUZE/BANDAGES/DRESSINGS) ×2 IMPLANT
CLOSURE WOUND 1/2 X4 (GAUZE/BANDAGES/DRESSINGS) ×1
CLOTH BEACON ORANGE TIMEOUT ST (SAFETY) ×3 IMPLANT
DRSG OPSITE POSTOP 4X10 (GAUZE/BANDAGES/DRESSINGS) ×3 IMPLANT
ELECT REM PT RETURN 9FT ADLT (ELECTROSURGICAL) ×3
ELECTRODE REM PT RTRN 9FT ADLT (ELECTROSURGICAL) ×1 IMPLANT
EXTRACTOR VACUUM M CUP 4 TUBE (SUCTIONS) IMPLANT
EXTRACTOR VACUUM M CUP 4' TUBE (SUCTIONS)
GLOVE BIOGEL PI IND STRL 7.0 (GLOVE) ×2 IMPLANT
GLOVE BIOGEL PI IND STRL 7.5 (GLOVE) ×2 IMPLANT
GLOVE BIOGEL PI INDICATOR 7.0 (GLOVE) ×4
GLOVE BIOGEL PI INDICATOR 7.5 (GLOVE) ×4
GLOVE ECLIPSE 7.5 STRL STRAW (GLOVE) ×3 IMPLANT
GOWN STRL REUS W/TWL LRG LVL3 (GOWN DISPOSABLE) ×9 IMPLANT
KIT ABG SYR 3ML LUER SLIP (SYRINGE) IMPLANT
NEEDLE HYPO 25X5/8 SAFETYGLIDE (NEEDLE) IMPLANT
NS IRRIG 1000ML POUR BTL (IV SOLUTION) ×3 IMPLANT
PACK C SECTION WH (CUSTOM PROCEDURE TRAY) ×3 IMPLANT
PAD ABD 8X10 STRL (GAUZE/BANDAGES/DRESSINGS) ×3 IMPLANT
PAD OB MATERNITY 4.3X12.25 (PERSONAL CARE ITEMS) ×3 IMPLANT
PENCIL SMOKE EVAC W/HOLSTER (ELECTROSURGICAL) ×3 IMPLANT
RTRCTR C-SECT PINK 25CM LRG (MISCELLANEOUS) ×3 IMPLANT
SPONGE GAUZE 4X4 12PLY STER LF (GAUZE/BANDAGES/DRESSINGS) ×3 IMPLANT
STRIP CLOSURE SKIN 1/2X4 (GAUZE/BANDAGES/DRESSINGS) ×2 IMPLANT
SUT VIC AB 0 CTX 36 (SUTURE) ×6
SUT VIC AB 0 CTX36XBRD ANBCTRL (SUTURE) ×3 IMPLANT
SUT VIC AB 2-0 CT1 27 (SUTURE) ×2
SUT VIC AB 2-0 CT1 TAPERPNT 27 (SUTURE) ×1 IMPLANT
SUT VIC AB 4-0 KS 27 (SUTURE) ×3 IMPLANT
TOWEL OR 17X24 6PK STRL BLUE (TOWEL DISPOSABLE) ×3 IMPLANT
TRAY FOLEY W/BAG SLVR 14FR LF (SET/KITS/TRAYS/PACK) ×3 IMPLANT

## 2018-06-20 NOTE — Anesthesia Procedure Notes (Signed)
Spinal  Patient location during procedure: OR Start time: 06/20/2018 5:30 PM End time: 06/20/2018 5:32 PM Staffing Anesthesiologist: Lyn Hollingshead, MD Performed: anesthesiologist  Preanesthetic Checklist Completed: patient identified, site marked, surgical consent, pre-op evaluation, timeout performed, IV checked, risks and benefits discussed and monitors and equipment checked Spinal Block Patient position: sitting Prep: site prepped and draped and DuraPrep Patient monitoring: continuous pulse ox and blood pressure Approach: midline Location: L3-4 Injection technique: single-shot Needle Needle type: Pencan  Needle gauge: 24 G Needle length: 10 cm Needle insertion depth: 4 cm Assessment Sensory level: T4

## 2018-06-20 NOTE — Transfer of Care (Signed)
Immediate Anesthesia Transfer of Care Note  Patient: Sara Pineda  Procedure(s) Performed: CESAREAN SECTION (N/A )  Patient Location: PACU  Anesthesia Type:Spinal  Level of Consciousness: awake, alert , oriented and patient cooperative  Airway & Oxygen Therapy: Patient Spontanous Breathing  Post-op Assessment: Report given to RN and Post -op Vital signs reviewed and stable  Post vital signs: Reviewed and stable  Last Vitals:  Vitals Value Taken Time  BP    Temp    Pulse 83 06/20/2018  6:55 PM  Resp 18 06/20/2018  6:55 PM  SpO2 99 % 06/20/2018  6:55 PM    Last Pain:  Vitals:   06/20/18 1515  TempSrc: Oral  PainSc: 0-No pain      Patients Stated Pain Goal: 4 (78/67/67 2094)  Complications: No apparent anesthesia complications

## 2018-06-20 NOTE — Op Note (Signed)
Janyla Girten PROCEDURE DATE: 06/20/2018  PREOPERATIVE DIAGNOSIS: Intrauterine pregnancy at  [redacted]w[redacted]d weeks gestation; IUGR with absent end diastolic flow, transverse position, bicornuate uterus  POSTOPERATIVE DIAGNOSIS: The same  PROCEDURE: Primary Low Transverse Cesarean Section  SURGEON:  Dr. Loma Boston  ASSISTANT: Dr Gerri Lins  INDICATIONS: Sara Pineda is a 32 y.o. G2P0010 at [redacted]w[redacted]d scheduled for cesarean section secondary to IUGR with absent end diastolic flow, transverse position, bicornuate uterus.  The risks of cesarean section discussed with the patient included but were not limited to: bleeding which may require transfusion or reoperation; infection which may require antibiotics; injury to bowel, bladder, ureters or other surrounding organs; injury to the fetus; need for additional procedures including hysterectomy in the event of a life-threatening hemorrhage; placental abnormalities wth subsequent pregnancies, incisional problems, thromboembolic phenomenon and other postoperative/anesthesia complications. The patient concurred with the proposed plan, giving informed written consent for the procedure.    FINDINGS:  Viable female infant in transverse presentation, delivered breech.  Apgars 7 and 8, weight, 3 pounds and 5 ounces.  Clear amniotic fluid.  Intact placenta, three vessel cord.  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  ANESTHESIA:    Spinal INTRAVENOUS FLUIDS:2400 ml ESTIMATED BLOOD LOSS: 700 ml URINE OUTPUT:  700 ml SPECIMENS: Placenta sent to pathology COMPLICATIONS: None immediate  PROCEDURE IN DETAIL:  The patient received intravenous antibiotics and had sequential compression devices applied to her lower extremities while in the preoperative area.  She was then taken to the operating room where spinal anesthesia was administered and was found to be adequate. She was then placed in a dorsal supine  position with a leftward tilt, and prepped and draped in a sterile manner.  A foley catheter was placed into her bladder and attached to constant gravity, which drained clear fluid throughout.  After an adequate timeout was performed, a Pfannenstiel skin incision was made with scalpel and carried through to the underlying layer of fascia. The fascia was incised in the midline and this incision was extended bilaterally using the Mayo scissors. Kocher clamps were applied to the superior aspect of the fascial incision and the underlying rectus muscles were dissected off bluntly. A similar process was carried out on the inferior aspect of the facial incision. The rectus muscles were separated in the midline bluntly and the peritoneum was entered bluntly. An Alexis retractor was placed to aid in visualization of the uterus.  Attention was turned to the lower uterine segment where a transverse hysterotomy was made with a scalpel and extended bilaterally bluntly and with bandage scissors. The infant was found to be transverse, head to maternal right. The infant's hips her found and brought into the hysterotomy and the baby was delivered breech in the normal manner. The infant was successfully delivered, and cord was clamped and cut and infant was handed over to awaiting neonatology team. Uterine massage was then administered and the placenta delivered intact with three-vessel cord. The uterus was then cleared of clot and debris.  The hysterotomy was closed with 0 Vicryl in a running locked fashion, and an imbricating layer was also placed with a 0 Vicryl. Overall, excellent hemostasis was noted. The abdomen and the pelvis were cleared of all clot and debris and the Ubaldo Glassing was removed. Hemostasis was confirmed on all surfaces.  The peritoneum was reapproximated using 2-0 vicryl running stitches. The fascia was then closed using 0 Vicryl in a running fashion. The subcutaneous layer was reapproximated with plain  gut and the  skin was closed with 4-0 vicryl. The patient tolerated the procedure well. Sponge, lap, instrument and needle counts were correct x 2. She was taken to the recovery room in stable condition.    Truett Mainland, DO 06/20/2018 6:43 PM

## 2018-06-20 NOTE — Procedures (Signed)
Sara Pineda 1986/09/13 [redacted]w[redacted]d  Fetus A Non-Stress Test Interpretation for 06/20/18  Indication: IUGR  Fetal Heart Rate A Mode: External Baseline Rate (A): 125 bpm Variability: Moderate Accelerations: 15 x 15, 10 x 10 Decelerations: None Multiple birth?: No  Uterine Activity Mode: Palpation, Toco Contraction Frequency (min): 1 UC Contraction Duration (sec): 40 Contraction Quality: Mild(denies feeling) Resting Tone Palpated: Relaxed Resting Time: Adequate  Interpretation (Fetal Testing) Nonstress Test Interpretation: Reactive Comments: FHR tracing rev'd by Dr. Donalee Citrin

## 2018-06-20 NOTE — Plan of Care (Signed)
  Problem: Education: Goal: Knowledge of condition will improve Outcome: Progressing

## 2018-06-20 NOTE — Anesthesia Preprocedure Evaluation (Signed)
Anesthesia Evaluation  Patient identified by MRN, date of birth, ID band Patient awake    Reviewed: Allergy & Precautions, H&P , NPO status , Patient's Chart, lab work & pertinent test results  Airway Mallampati: I  TM Distance: >3 FB Neck ROM: full    Dental no notable dental hx. (+) Teeth Intact   Pulmonary neg pulmonary ROS,    Pulmonary exam normal breath sounds clear to auscultation       Cardiovascular negative cardio ROS Normal cardiovascular exam Rhythm:regular Rate:Normal     Neuro/Psych negative neurological ROS  negative psych ROS   GI/Hepatic negative GI ROS, Neg liver ROS,   Endo/Other  negative endocrine ROS  Renal/GU negative Renal ROS  negative genitourinary   Musculoskeletal negative musculoskeletal ROS (+)   Abdominal Normal abdominal exam  (+)   Peds  Hematology negative hematology ROS (+)   Anesthesia Other Findings   Reproductive/Obstetrics (+) Pregnancy                             Anesthesia Physical Anesthesia Plan  ASA: II  Anesthesia Plan: Spinal   Post-op Pain Management:    Induction:   PONV Risk Score and Plan: 3 and Ondansetron, Dexamethasone and Scopolamine patch - Pre-op  Airway Management Planned: Natural Airway and Nasal Cannula  Additional Equipment:   Intra-op Plan:   Post-operative Plan:   Informed Consent: I have reviewed the patients History and Physical, chart, labs and discussed the procedure including the risks, benefits and alternatives for the proposed anesthesia with the patient or authorized representative who has indicated his/her understanding and acceptance.     Plan Discussed with: CRNA and Surgeon  Anesthesia Plan Comments:         Anesthesia Quick Evaluation

## 2018-06-20 NOTE — H&P (Signed)
Faculty Practice H&P  Sara Pineda is a 32 y.o. female G77P0010 with IUP at [redacted]w[redacted]d presenting for primary cesarean section for IUGR with absent end diastolic flow. 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    Pt states she has been having no contractions, no vaginal bleeding, intact membranes, with normal fetal movement.     Prenatal Course Source of Care: [facility] with onset of care at [id number]  Pregnancy complications or risks: Patient Active Problem List   Diagnosis Date Noted  . IUGR (intrauterine growth restriction) affecting care of mother 06/15/2018  . Bleeding gums 04/11/2018  . 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 Family history of genetic disorder   . Supervision of other normal pregnancy, antepartum 01/10/2018  . 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 Hereditary disease in family possibly affecting pregnancy, antepartum 12/18/2015   She plans to breastfeed  Prenatal labs and studies: ABO, Rh: A/Positive/-- (04/02 0954) Antibody: Negative (04/02 0954) Rubella: 5.29 (04/02 0954) RPR: Non Reactive (08/01 0845)  HBsAg: Negative (04/02 0954)  HIV: Non Reactive (08/01 0845)  GBS:    2hr Glucola: negative Genetic screening: normal Anatomy US: normal  Past Medical History:  Past Medical History:  Diagnosis Date  . Medical history non-contributory     Past Surgical History:  Past Surgical History:  Procedure Laterality Date  . DILATION AND EVACUATION N/A 01/06/2016   Procedure: DILATATION AND EVACUATION with Ultrasound Guidance;  Surgeon: Thurnell Lose, MD;  Location: [facility];  Service: Gynecology;  Laterality: N/A;    Obstetrical History:  OB History    Gravida  2   Para      Term      Preterm      AB  1   Living  0     SAB  1   TAB      Ectopic      Multiple      Live Births              Gynecological History:  OB History    Gravida  2   Para      Term      Preterm      AB  1   Living  0     SAB  1   TAB      Ectopic      Multiple      Live Births              Social History:  Social History   Socioeconomic History  . Marital status: Married    Spouse name: Not on file  . Number of children: Not on file  . Years of education: Not on file  . Highest education level: Not on file  Occupational History  . Not on file  Social Needs  . Financial resource strain: Not on file  . Food insecurity:    Worry: Not on file    Inability: Not on file  . Transportation needs:    Medical: Not on file    Non-medical: Not on file  Tobacco Use  . Smoking status: Never Smoker  . Smokeless tobacco: Never Used  Substance and Sexual Activity  . Alcohol use: No  . Drug use: No  . Sexual activity: Yes    Birth control/protection: None    Comment: approx [redacted] wks gestation  Lifestyle  . Physical activity:    Days per week: Not on  file    Minutes per session: Not on file  . Stress: Not on file  Relationships  . Social connections:    Talks on phone: Not on file    Gets together: Not on file    Attends religious service: Not on file    Active member of club or organization: Not on file    Attends meetings of clubs or organizations: Not on file    Relationship status: Not on file  Other Topics Concern  . Not on file  Social History Narrative  . Not on file    Family History: No family history on file.  Medications:  Prenatal vitamins,  Current Facility-Administered Medications  Medication Dose Route Frequency Provider Last Rate Last Dose  . [START ON 06/21/2018] ceFAZolin (ANCEF) IVPB 2g/100 mL premix  2 g Intravenous On Call to OR Truett Mainland, DO      . lactated ringers infusion  125 mL/hr Intravenous Continuous Truett Mainland, DO 125 mL/hr at 06/20/18 1515 125 mL/hr at 06/20/18 1515    Allergies: No Known Allergies  Review of Systems: - negative  Physical Exam: Blood  pressure 134/84, pulse (!) 101, temperature 99.2 F (37.3 C), temperature source Oral, resp. rate 18, height 5' 1.5" (1.562 m), weight 64.4 kg, last menstrual period 10/20/2017, unknown if currently breastfeeding. GENERAL: Well-developed, well-nourished female in no acute distress.  LUNGS: Clear to auscultation bilaterally.  HEART: Regular rate and rhythm. ABDOMEN: Soft, nontender, nondistended, gravid. EFW 4 lbs EXTREMITIES: Nontender, no edema, 2+ distal pulses. FHT:  Baseline rate 125 bpm   Variability moderate  Accelerations present   Decelerations none Contractions: none   Pertinent Labs/Studies:   Lab Results  Component Value Date   WBC 8.6 05/11/2018   HGB 11.5 05/11/2018   HCT 36.4 05/11/2018   MCV 95 05/11/2018   PLT 272 05/11/2018    Assessment : Sara Pineda is a 32 y.o. G2P0010 at [redacted]w[redacted]d being admitted for cesarean section secondary to IUGR, malposition  Plan: The risks of cesarean section discussed with the patient included but were not limited to: bleeding which may require transfusion or reoperation; infection which may require antibiotics; injury to bowel, bladder, ureters or other surrounding organs; injury to the fetus; need for additional procedures including hysterectomy in the event of a life-threatening hemorrhage; placental abnormalities wth subsequent pregnancies, incisional problems, thromboembolic phenomenon and other postoperative/anesthesia complications. The patient concurred with the proposed plan, giving informed written consent for the procedure.   Patient has been NPO since 9am and will remain NPO for procedure.  Preoperative prophylactic Ancef ordered on call to the OR.    Truett Mainland, DO 06/20/2018, 3:29 PM

## 2018-06-20 NOTE — Anesthesia Postprocedure Evaluation (Signed)
Anesthesia Post Note  Patient: Camiya Salvetti  Procedure(s) Performed: CESAREAN SECTION (N/A )     Patient location during evaluation: PACU Anesthesia Type: Spinal Level of consciousness: awake and alert Pain management: pain level controlled Vital Signs Assessment: post-procedure vital signs reviewed and stable Respiratory status: spontaneous breathing and respiratory function stable Cardiovascular status: blood pressure returned to baseline and stable Postop Assessment: spinal receding and no apparent nausea or vomiting Anesthetic complications: no    Last Vitals:  Vitals:   06/20/18 1940 06/20/18 1945  BP:  (!) 122/97  Pulse: 80 80  Resp: 16 13  Temp:  36.7 C  SpO2: 100% 97%    Last Pain:  Vitals:   06/20/18 1945  TempSrc: Oral  PainSc: 0-No pain   Pain Goal: Patients Stated Pain Goal: 4 (06/20/18 1515)               Audry Pili

## 2018-06-21 ENCOUNTER — Encounter (HOSPITAL_COMMUNITY): Payer: Self-pay | Admitting: Family Medicine

## 2018-06-21 ENCOUNTER — Other Ambulatory Visit: Payer: Self-pay

## 2018-06-21 LAB — RPR: RPR Ser Ql: NONREACTIVE

## 2018-06-21 LAB — CBC
HCT: 31.9 % — ABNORMAL LOW (ref 36.0–46.0)
HEMOGLOBIN: 11 g/dL — AB (ref 12.0–15.0)
MCH: 32.8 pg (ref 26.0–34.0)
MCHC: 34.5 g/dL (ref 30.0–36.0)
MCV: 95.2 fL (ref 78.0–100.0)
Platelets: 252 10*3/uL (ref 150–400)
RBC: 3.35 MIL/uL — AB (ref 3.87–5.11)
RDW: 12.7 % (ref 11.5–15.5)
WBC: 17.9 10*3/uL — AB (ref 4.0–10.5)

## 2018-06-21 MED ORDER — AMOXICILLIN-POT CLAVULANATE 875-125 MG PO TABS: 1.0000 | ORAL_TABLET | Freq: Two times a day (BID) | ORAL | Status: DC

## 2018-06-21 NOTE — Addendum Note (Signed)
Addendum  created 06/21/18 0745 by Talbot Grumbling, CRNA   Sign clinical note

## 2018-06-21 NOTE — Progress Notes (Signed)
Postpartum Day 1: Cesarean Delivery for IUGR, AEDF at [redacted]w[redacted]d Subjective: Patient reports incisional pain, tolerating PO, + flatus and no problems voiding.  Breastfeeding, baby girl doing well in NICU.  Objective: Vital signs in last 24 hours: Temp:  [97.6 F (36.4 C)-99.2 F (37.3 C)] 98.4 F (36.9 C) (09/11 0740) Pulse Rate:  [74-101] 81 (09/11 0912) Resp:  [4-20] 13 (09/11 0740) BP: (110-134)/(70-97) 118/70 (09/11 0740) SpO2:  [95 %-100 %] 97 % (09/11 0912) Weight:  [64.4 kg] 64.4 kg (09/10 1501)  Physical Exam:  General: alert and no distress Lochia: appropriate Uterine Fundus: firm Incision: no significant drainage DVT Evaluation: No evidence of DVT seen on physical exam. Negative Homan's sign. No cords or calf tenderness. No significant calf/ankle edema.  Recent Labs    06/20/18 1516 06/21/18 0536  HGB 12.9 11.0*  HCT 37.9 31.9*    Assessment/Plan: Status post Cesarean section. Doing well postoperatively.  Natural family planning for contraception Continue current care. Discharge to home tomorrow.  Verita Schneiders, MD 06/21/2018, 10:44 AM

## 2018-06-21 NOTE — Anesthesia Postprocedure Evaluation (Signed)
Anesthesia Post Note  Patient: Sara Pineda  Procedure(s) Performed: CESAREAN SECTION (N/A )     Patient location during evaluation: [facility] Anesthesia Type: Spinal Level of consciousness: awake and alert Pain management: pain level controlled Vital Signs Assessment: post-procedure vital signs reviewed and stable Respiratory status: spontaneous breathing Cardiovascular status: blood pressure returned to baseline Postop Assessment: no headache, no backache, spinal receding, able to ambulate, adequate PO intake, no apparent nausea or vomiting and patient able to bend at knees Anesthetic complications: no    Last Vitals:  Vitals:   06/21/18 0357 06/21/18 0740  BP: 110/83 118/70  Pulse: 82 80  Resp: 17 13  Temp: 37.1 C 36.9 C  SpO2: 98% 98%    Last Pain:  Vitals:   06/21/18 0740  TempSrc: Oral  PainSc:    Pain Goal: Patients Stated Pain Goal: 4 (06/21/18 0500)               [facility]

## 2018-06-21 NOTE — Lactation Note (Signed)
This note was copied from a baby's chart. Lactation Consultation Note  Patient Name: Sara Pineda Today's Date: 06/21/2018 Reason for consult: Initial assessment;Late-preterm 34-36.6wks;Primapara;1st time breastfeeding;NICU baby;Other (Comment);Infant weight loss(IUGR, AMA)  74 hours old LPI female NICU baby, she's on TPN. Mom started pumping yesterday already, she only pumped twice today but she's very discouraged because "nothing is coming out". Explained to mom that at this point, she's not supposed to get volume yet, and that the main purpose of the pumping is for breast stimulation. Discussed the onset of lactogenesis II for a 34 weeker. Mom voiced understanding and praised her for her efforts. Mom is a P1, doesn't have a pump at home, but dad has [employer] and will be calling them to get a DEBP.  Noticed that the junctures in her pump were lose, LC adjusted them and let mom know that she'll notice a difference in the pumping the next time she pumps. Milk storage guidelines for NICU babies were reviewed. Taught mom how to hand express, no colostrum noted yet. Noticed that mom has flat nipples and that they also invert when hand expressing, her tissue is not very compressible. Mom voiced her nipples were sore, LC noticed that RN has already brought some coconut oil in the room, explained to mom how to use it since she doesn't have colostrum yet to treat sore nipples.  Plan:  1. Encouraged mom to pump every 3 hours and at least once at night; a total of 6-8 times/24 hours 2. Mom will apply coconut oil to her nipple/areola complex prior every pumping session to help with lubrication 3. Once she starts getting drops, she'll save them into the snappie and take them to the NICU (if within 4 hours of pumping) of give it to her RN to be refrigerated.  BF brochure, BF resources and pumping log were reviewed, both parents are aware of [facility] services and will call PRN.  Maternal Data Formula  Feeding for Exclusion: No Has patient been taught Hand Expression?: Yes Does the patient have breastfeeding experience prior to this delivery?: No  Feeding   Interventions Interventions: Breast feeding basics reviewed;Breast massage;Breast compression;Hand express;Coconut oil;DEBP  Lactation Tools Discussed/Used Tools: Pump;Coconut oil Breast pump type: Double-Electric Breast Pump WIC Program: No Pump Review: Setup, frequency, and cleaning;Milk Storage Initiated by:: RN and IBCLC Date initiated:: 06/20/18   Consult Status Consult Status: Follow-up Date: 06/22/18 Follow-up type: In-patient    Sara Pineda 06/21/2018, 1:48 PM

## 2018-06-22 MED ORDER — IBUPROFEN 600 MG PO TABS: 600.0000 mg | ORAL_TABLET | Freq: Four times a day (QID) | ORAL | 0 refills | Status: DC

## 2018-06-22 MED ORDER — OXYCODONE-ACETAMINOPHEN 5-325 MG PO TABS: 1.0000 | ORAL_TABLET | ORAL | 0 refills | Status: DC | PRN

## 2018-06-22 NOTE — Discharge Instructions (Signed)

## 2018-06-22 NOTE — Progress Notes (Signed)
Postpartum Day 2: Cesarean Delivery for IUGR, AEDF at [redacted]w[redacted]d Subjective: Patient reports incisional pain, tolerating PO, + flatus and no problems voiding.  Breastfeeding, baby girl doing well in NICU. Wants to go home tomorrow  Objective: Vital signs in last 24 hours: Temp:  [97.9 F (36.6 C)-99.1 F (37.3 C)] 98.1 F (36.7 C) (09/12 0743) Pulse Rate:  [89-105] 96 (09/12 0943) Resp:  [16-18] 16 (09/12 0743) BP: (96-132)/(64-85) 114/80 (09/12 0743) SpO2:  [95 %-98 %] 98 % (09/12 0943)  Physical Exam:  General: alert and no distress Lochia: appropriate Uterine Fundus: firm Incision: no significant drainage DVT Evaluation: No evidence of DVT seen on physical exam. Negative Homan's sign. No cords or calf tenderness. No significant calf/ankle edema.  Recent Labs    06/20/18 1516 06/21/18 0536  HGB 12.9 11.0*  HCT 37.9 31.9*    Assessment/Plan: Status post Cesarean section. Doing well postoperatively.  Natural family planning for contraception Continue current care. Discharge to home tomorrow; declines discharge today.  Verita Schneiders, MD 06/22/2018, 11:36 AM

## 2018-06-22 NOTE — Discharge Summary (Addendum)
OB Discharge Summary     Patient Name: Caycee Wanat DOB: 12/02/1985 MRN: [id number]  Date of admission: 06/20/2018 Delivering MD: Truett Mainland   Date of discharge: 06/23/2018  Admitting diagnosis: absent end diastolic flow with IUGR Intrauterine pregnancy: [redacted]w[redacted]d     Secondary diagnosis:  Active Problems:   Status post cesarean section    Discharge diagnosis: Preterm Pregnancy Delivered                                                                                                Post partum procedures:None  Complications: None  Hospital course:  Sceduled C/S   32 y.o. yo G2P0010 at [redacted]w[redacted]d was admitted to the hospital 06/20/2018 for scheduled cesarean section with the following indication:IUGR, absent end-diastolic flow on umbilical artery dopplers, transverse lie.  Membrane Rupture Time/Date: 6:02 PM ,06/20/2018   Patient delivered a Viable infant.06/20/2018  Details of operation can be found in separate operative note.  Patient had an uncomplicated postpartum course.  She is ambulating, tolerating a regular diet, passing flatus, and urinating well. Patient is discharged home in stable condition on  06/23/18         Physical exam  Vitals:   06/22/18 1604 06/22/18 2026 06/23/18 0407 06/23/18 0737  BP: 122/76 107/77 115/74 120/87  Pulse: (!) 109 (!) 105 87 97  Resp: 18 18 16 18   Temp: 99 F (37.2 C) 98.2 F (36.8 C) 98.3 F (36.8 C) 98.2 F (36.8 C)  TempSrc:  Oral  Oral  SpO2: 97% 99% 98% 99%  Weight:      Height:       General: alert, cooperative and no distress Lochia: appropriate Uterine Fundus: firm Incision: Healing well with no significant drainage, No significant erythema, Dressing is clean, dry, and intact DVT Evaluation: No evidence of DVT seen on physical exam. Negative Homan's sign. No cords or calf tenderness. Labs: Lab Results  Component Value Date   WBC 17.9 (H) 06/21/2018   HGB 11.0 (L) 06/21/2018   HCT 31.9 (L) 06/21/2018   MCV 95.2  06/21/2018   PLT 252 06/21/2018   No flowsheet data found.  Discharge instruction: per After Visit Summary and "Baby and Me Booklet".  After visit meds:  Allergies as of 06/23/2018   No Known Allergies     Medication List    TAKE these medications   ibuprofen 600 MG tablet Commonly known as:  ADVIL,MOTRIN Take 1 tablet (600 mg total) by mouth every 6 (six) hours.   oxyCODONE-acetaminophen 5-325 MG tablet Commonly known as:  PERCOCET/ROXICET Take 1 tablet by mouth every 4 (four) hours as needed (pain scale 4-7).   prenatal multivitamin Tabs tablet Take 1 tablet by mouth daily at 12 noon.   ranitidine 150 MG tablet Commonly known as:  ZANTAC Take 1 tablet (150 mg total) by mouth 2 (two) times daily.       Diet: routine diet  Activity: Advance as tolerated. Pelvic rest for 6 weeks.    Future Appointments  Date Time Provider [place]ilda  07/06/2018 10:15 AM [place]  07/24/2018  4:40  PM Glenice Bow, DO [facility]   Postpartum contraception: Natural Family Planning  Newborn Data: Live born female  Birth Weight: 3 lb 4.9 oz (1500 g) APGAR: 21, 8  Newborn Delivery   Birth date/time:  06/20/2018 18:05:00 Delivery type:  C-Section, Low Transverse Trial of labor:  No C-section categorization:  Primary     Baby Feeding: Breast Disposition:NICU   06/23/2018 Verita Schneiders, MD

## 2018-06-23 ENCOUNTER — Other Ambulatory Visit (HOSPITAL_COMMUNITY): Payer: BLUE CROSS/BLUE SHIELD

## 2018-06-23 ENCOUNTER — Ambulatory Visit (HOSPITAL_COMMUNITY): Payer: BLUE CROSS/BLUE SHIELD

## 2018-06-23 ENCOUNTER — Encounter (HOSPITAL_COMMUNITY): Payer: Self-pay | Admitting: *Deleted

## 2018-06-23 NOTE — Lactation Note (Signed)
This note was copied from a baby's chart. Lactation Consultation Note  Patient Name: Girl Nhu Glasby [id number] Date: 06/23/2018 Reason for consult: Follow-up assessment;NICU baby Mom reports obtaining small drops with pumping.  Instructed to pump 8 times in 24 hours.  Discussed milk coming to volume.  Mom needs a pump for discharge.  [facility] referral faxed.  Maternal Data    Feeding Feeding Type: Donor Breast Milk Length of feed: 30 min  LATCH Score                   Interventions    Lactation Tools Discussed/Used     Consult Status Consult Status: PRN    Ave Filter 06/23/2018, 8:55 AM

## 2018-06-23 NOTE — Progress Notes (Signed)
Pt discharged to home with husband.  Condition stable.  Pt ambulated to NICU with plans to leave hospital from there.  Pt planning to get DEBP at [facility] office this afternoon.  No equipment for home ordered at discharge.

## 2018-06-24 ENCOUNTER — Ambulatory Visit: Payer: Self-pay

## 2018-06-24 NOTE — Lactation Note (Signed)
This note was copied from a baby's chart. Lactation Consultation Note  Patient Name: Girl Layza Summa Today's Date: 06/24/2018   Asked by baby's nurse to come to NICU to speak to Mom.  Mom in recliner with baby STS in cradle hold, baby being tube fed.   Mom concerned that she isn't getting more than 5-6 ml when she pumps every 3 hrs.  Baby 54 hrs old (4 days).  Both breasts filling, and Mom feeling painful knots on right breast.  Mom hadn't pumped in 4 hrs as she was wanting to wait to see [place].  Went into pump room in the NICU.  Assisted Mom with pumping, using the 24 mm flanges.  Mom states she uses the 27 mm flanges at home.  24 mm flanges, nipples move freely, noted slight compression in flange.  Mom denies pain.  Mom knows to use coconut oil for lubrication during pumping.  Assisted Mom to use breast massage prior to pumping.  Breasts are full.  Right breast with some full ducts palpated.    Adjusted Mom's position, talked about hand's free bra to be able to massage breast during pumping.  Milk flowing now.   Assisted with breast compression to assist with milk flow.  Plan- 1- Do STS with baby as much as possible, pumping breasts after 2-Pump both breasts every 2-3 hrs (8-12 times per 24 hrs) 3- breast massage prior to pumping 4- Ice packs on breasts, in reclining position, for 20 mins if breasts become engorged.   5- warm massage prior to pumping only. 6- set alarm at night, to not go over 4 hrs before pumping.  Reassured Mom, and praised her for doing such a good job.  Using a WIC pump at home.    Mom knows she can call prn for concerns.   Broadus John 06/24/2018, 3:09 PM

## 2018-06-29 ENCOUNTER — Ambulatory Visit (INDEPENDENT_AMBULATORY_CARE_PROVIDER_SITE_OTHER): Payer: BLUE CROSS/BLUE SHIELD | Admitting: General Practice

## 2018-06-29 VITALS — BP 115/75 | HR 94 | Ht 61.0 in | Wt 138.0 lb

## 2018-06-29 DIAGNOSIS — Z5189 Encounter for other specified aftercare: Secondary | ICD-10-CM

## 2018-06-29 NOTE — Progress Notes (Signed)
Patient presents to office today concerned about her c-section incision. Patient reports numbness around incision and small bumps 2-3 inches above her incision. Appears patient has mild rash in area where tape was present. Discussed with patient numbness or burning are common/normal sensations after a c-section. Recommended hydrocortisone cream for irritated area from tape. Incision is clean, dry, & intact with steri strips present. Reviewed wound care and S&S of infection with patient. Patient verbalized understanding to all & will return for pp visit.

## 2018-06-30 ENCOUNTER — Encounter: Payer: BLUE CROSS/BLUE SHIELD | Admitting: Obstetrics & Gynecology

## 2018-06-30 NOTE — Progress Notes (Signed)
Chart reviewed for nurse visit. Agree with plan of care.   Sara Pineda, [name] 06/30/2018 11:21 AM

## 2018-07-01 ENCOUNTER — Ambulatory Visit: Payer: Self-pay

## 2018-07-01 NOTE — Lactation Note (Signed)
This note was copied from a baby's chart. Lactation Consultation Note  Patient Name: Sara Pineda Date: 07/01/2018 Reason for consult: Follow-up assessment;NICU baby;Late-preterm 34-36.6wks;Primapara;1st time breastfeeding  NICU RN called to assist with feeding, mom has been trying to take baby to the breast for the last 4-5 days, but not consistently. Baby may start getting bottles tomorrow, mom wants to try to BF. Appt was set up to 9 pm, but LC had to wait in NICU pod for RN to finish assessing baby. Baby currently on 26 calorie formula, donor milk and mother's milk.  LC did some suck training, baby does well as sucking on pacifier but kept clamping on gloved finger. Once baby started sucking, pattern was very uncoordinated, but she did some suckles. Fit mom with a NS #20 which seems to be a better fit for her breast but too big for baby's mouth. Tried a NS # 16 afterwards and baby was able to sustain the "suckling" for a bit longer, about 5 minutes. NS fit a bit snugger on mom's breast but mom voiced fit is still comfortable. A tiny drop of colostrum noted on NS but it was probably from mom and LC doing breast compressions, very unlikely that baby is transferring at this point.  Plan:  1. Encouraged mom to keep putting baby to the breast STS but not to exceed feeding attempts to more than 30 minutes at a time, start with 10 and always keeping baby STS 2. Parents will work on suck training prior latching baby to the breast 3. Mom will use NS# 16 to latch baby on but she'll take the NS#20 for posterior use once baby is older  4. Mom will continue pumping every 3 hours and at least once at night and turn any amount of EBM to NICU RN  Both parents aware of [place] [facility] services and will call PRN. NICU RN will assist with feeding in the meantime.   Maternal Data    Feeding Feeding Type: Breast Fed Length of feed: 5 min  LATCH Score Latch: Repeated attempts needed to sustain latch,  nipple held in mouth throughout feeding, stimulation needed to elicit sucking reflex.(with NS # 16)  Audible Swallowing: None  Type of Nipple: Everted at rest and after stimulation(very short shafted)  Comfort (Breast/Nipple): Soft / non-tender  Hold (Positioning): Assistance needed to correctly position infant at breast and maintain latch.  LATCH Score: 6  Interventions Interventions: Breast feeding basics reviewed;Assisted with latch;Skin to skin;Breast massage;Hand express;Breast compression;Adjust position;Support pillows;Position options  Lactation Tools Discussed/Used Tools: Nipple Shields Nipple shield size: 16;20   Consult Status Consult Status: PRN Follow-up type: In-patient    Jekhi Bolin Francene Boyers 07/01/2018, 9:56 PM

## 2018-07-06 ENCOUNTER — Ambulatory Visit: Payer: BLUE CROSS/BLUE SHIELD

## 2018-07-24 ENCOUNTER — Ambulatory Visit: Payer: BLUE CROSS/BLUE SHIELD | Admitting: Family Medicine

## 2018-08-08 ENCOUNTER — Encounter: Payer: Self-pay | Admitting: Advanced Practice Midwife

## 2018-08-08 ENCOUNTER — Ambulatory Visit (INDEPENDENT_AMBULATORY_CARE_PROVIDER_SITE_OTHER): Payer: BLUE CROSS/BLUE SHIELD | Admitting: Student

## 2018-08-08 DIAGNOSIS — Z1389 Encounter for screening for other disorder: Secondary | ICD-10-CM | POA: Diagnosis not present

## 2018-08-08 MED ORDER — DOCUSATE SODIUM 100 MG PO CAPS: 100.0000 mg | ORAL_CAPSULE | Freq: Two times a day (BID) | ORAL | 0 refills | Status: DC

## 2018-08-08 NOTE — Progress Notes (Signed)
Subjective:     Sara Pineda is a 32 y.o. female who presents for a postpartum visit. She is 7 weeks postpartum following a low cervical transverse Cesarean section. I have fully reviewed the prenatal and intrapartum course. The delivery was at 34.1 gestational weeks. Outcome: primary cesarean section, low transverse incision. Anesthesia: spinal. Postpartum course has been unremarkable. Baby's course has been unremarkable. Baby is feeding by both breast and bottle - Similac Neosure. Bleeding no bleeding. Bowel function is normal. Bladder function is normal. Patient is not sexually active. Contraception method is none. Postpartum depression screening: negative.  Patient feels that she is having pain above her incision. She says it is worse with movement. States it feels "full" at times. She is noticing it now that she is not taking her medications. She denies nausea and vomiting. She endorses some constipation and would like medicine for that. She also says that her nipples hurt and she would like medicine for that. She is exclusively pumping.  Nipples hurt only after pumping, not any other time.   Review of Systems Pertinent items are noted in HPI.   Objective:    BP 98/64   Pulse 81   Wt 124 lb (56.2 kg)   BMI 23.43 kg/m   General:  alert, cooperative and no distress   Breasts:  inspection negative, no nipple discharge or bleeding, no masses or nodularity palpable. No signs of yeast, no flaking.   Lungs: clear to auscultation bilaterally  Heart:  regular rate and rhythm, S1, S2 normal, no murmur, click, rub or gallop  Abdomen: soft, non-tender; bowel sounds normal; no masses,  no organomegaly. Incision is well-healed; no drainage, tenderness. Abdomen is soft, no areas of hardness or redness or masses.    Vulva:  not evaluated  Vagina: not evaluated  Cervix:  not examined  Corpus: not examined  Adnexa:  not evaluated  Rectal Exam: Not performed.        Assessment:    Healthy  postpartum exam. Pap smear not done at today's visit.   Plan:    1. Contraception: none. "We will handle it ourselves" 2. Colace RX given, suggested that patient turn down pump strength and see if that helps, wash all pump parts carefully. Recommend that patient follow up with lactation.  3. Follow up in: 5 weeks or as needed for nurse visit to look at wound.  4. Consulted with Dr. Ihor Dow about patient's abdominal pain; Dr. Ihor Dow gave reassurance of normalcy of healing process and recommendation is that patient follows up in 5 more week. Husband says he would like a second opinion on her abdominal pain; he will research places to go.

## 2018-08-08 NOTE — Patient Instructions (Signed)

## 2018-09-12 ENCOUNTER — Ambulatory Visit (INDEPENDENT_AMBULATORY_CARE_PROVIDER_SITE_OTHER): Payer: BLUE CROSS/BLUE SHIELD | Admitting: Family Medicine

## 2018-09-12 DIAGNOSIS — Z98891 History of uterine scar from previous surgery: Secondary | ICD-10-CM | POA: Diagnosis not present

## 2018-09-12 DIAGNOSIS — R1031 Right lower quadrant pain: Secondary | ICD-10-CM | POA: Diagnosis not present

## 2018-09-12 DIAGNOSIS — R140 Abdominal distension (gaseous): Secondary | ICD-10-CM

## 2018-09-12 NOTE — Progress Notes (Signed)
   GYNECOLOGY OFFICE VISIT NOTE History:  32 y.o. G2P1011 here today for incision check. She had her C/S 06/20/18, about 3 months ago, for IUGR with absent end diastolic flow, transverse position and bicornuate uterus. Was here for a nurse visit but wanted to see a provider.   - pain mostly on right side just above incision - burning pain at times - swelling in RLQ with activities - irregular bowel movements, sometimes every 2-3 days, not painful - not taking medications - thinks something is wrong, wants imaging to see   The following portions of the patient's history were reviewed and updated as appropriate: allergies, current medications, past family history, past medical history, past social history, past surgical history and problem list.   Review of Systems:  Pertinent items noted in HPI Review of Systems  Constitutional: Negative for chills and fever.  Gastrointestinal: Positive for abdominal pain and constipation. Negative for blood in stool, diarrhea and nausea.  Genitourinary: Negative for dysuria and frequency.  Musculoskeletal: Negative for back pain.    Objective:  Physical Exam There were no vitals taken for this visit. Physical Exam  Constitutional: She is oriented to person, place, and time. She appears well-developed and well-nourished. No distress.  HENT:  Head: Normocephalic and atraumatic.  Cardiovascular: Normal rate.  Pulmonary/Chest: No respiratory distress.  Abdominal: Soft. Bowel sounds are normal. She exhibits mass (fullness in RLQ>LLQ). She exhibits no distension. There is tenderness (mild RLQ). There is no guarding.  Genitourinary:  Genitourinary Comments: deferred  Neurological: She is alert and oriented to person, place, and time.  Skin: Skin is warm and dry. No rash noted.  Psychiatric: She has a normal mood and affect. Her behavior is normal.  Nursing note and vitals reviewed.  Labs and Imaging No results found for this or any previous visit  (from the past 168 hour(s)). No results found.  Assessment & Plan:  [id number] who is about 3 months postpartum from Cesarean section for IUGR in setting of bicornuate uterus, transverse position here with continuing lower abdominal/pelvic pain. Incision appears to be healing well, no palpable thickened scar tissue or defects. Slight fullness in RLQ but very mild. My suspicion is this is related to gaseous distension related to irregular bowel movements. Superficial neuropathic pain normal in setting of recent surgery, advised topical lidocaine and/or Vitamin E as possibly being helpful. Discussed recommendations for ibuprofen, bowel regimen and physical therapy for muscle rehab but family would prefer imaging first. Able to schedule CT A/P 09/19/18, then will follow-up in clinic at the end of the month. Reviewed return precautions.   Return in about 1 month (around 10/13/2018) for abdominal pain, swelling .  Lambert Mody. Juleen China, DO OB Family Medicine Fellow, [facility] for [employer], [place]

## 2018-09-12 NOTE — Progress Notes (Signed)
Pt here for Wound check,switched to Provider [place].

## 2018-09-12 NOTE — Progress Notes (Signed)
Advised Pt of location of CT Scan & that they will need to pick up 2 bottles of Oral Contrast from [facility] before Thursday. No food 4 hrs before exam. Pt verbalized understanding.

## 2018-09-12 NOTE — Patient Instructions (Addendum)
   Try Miralax (1 packet/capful in morning, 1 packet/capful in evening) with Colace to help with regular bowel movements. You can also try increasing how much water your drink and trying fiber like dried apricots.  Consider trial of simethicone for gas pain   Try ibuprofen (600mg  or 3 tablets) when you notice pain and swelling. Also try ice or heat, whichever feels better.   You can try topical lidocaine (Salon-pas brand) to help with pain and irritation of skin

## 2018-09-15 ENCOUNTER — Ambulatory Visit (HOSPITAL_COMMUNITY): Payer: BLUE CROSS/BLUE SHIELD

## 2018-09-19 ENCOUNTER — Ambulatory Visit (HOSPITAL_COMMUNITY)
Admission: RE | Admit: 2018-09-19 | Discharge: 2018-09-19 | Disposition: A | Discharge status: Home / Self Care | Payer: BLUE CROSS/BLUE SHIELD | Source: Ambulatory Visit | Attending: Family Medicine | Admitting: Family Medicine

## 2018-09-19 DIAGNOSIS — R140 Abdominal distension (gaseous): Secondary | ICD-10-CM | POA: Insufficient documentation

## 2018-09-19 MED ORDER — IOHEXOL 300 MG/ML  SOLN
100.0000 mL | Freq: Once | INTRAMUSCULAR | Status: AC | PRN
  Administered 2018-09-19: 100 mL via INTRAVENOUS

## 2018-09-19 MED ORDER — SODIUM CHLORIDE (PF) 0.9 % IJ SOLN
INTRAMUSCULAR | Status: AC
  Filled 2018-09-19: qty 50

## 2018-10-09 ENCOUNTER — Encounter: Payer: Self-pay | Admitting: Obstetrics & Gynecology

## 2018-10-09 ENCOUNTER — Ambulatory Visit (INDEPENDENT_AMBULATORY_CARE_PROVIDER_SITE_OTHER): Payer: BLUE CROSS/BLUE SHIELD | Admitting: Obstetrics & Gynecology

## 2018-10-09 VITALS — BP 101/63 | HR 78 | Ht 61.0 in | Wt 137.4 lb

## 2018-10-09 DIAGNOSIS — G8918 Other acute postprocedural pain: Secondary | ICD-10-CM

## 2018-10-09 NOTE — Progress Notes (Signed)
History:  32 y.o. G2P0111 here today for f/u of post op. Pain is improved. Pt is voiding and passing stools without difficulty. She is not taking meds for pain. She reports that 2 days ago she had pain that led her to have difficulty voiding. That has resolved as well.          The following portions of the patient's history were reviewed and updated as appropriate: allergies, current medications, past family history, past medical history, past social history, past surgical history and problem list.  Review of Systems:  Pertinent items are noted in HPI.    Objective:  Physical Exam Blood pressure 101/63, pulse 78, height 5\' 1"  (1.549 m), weight 137 lb 6.4 oz (62.3 kg), last menstrual period 09/23/2018, currently breastfeeding. Gen: NAD Abd: Soft, nontender and nondistended Pelvic: Normal appearing external genitalia; normal appearing vaginal mucosa and cervix.  Normal discharge.  Small uterus, no other palpable masses, no uterine or adnexal tenderness  Labs and Imaging Ct Abdomen Pelvis W Contrast  Result Date: 09/19/2018 CLINICAL DATA:  Pelvic pain, abdominal distention. C-section 3 months ago. EXAM: CT ABDOMEN AND PELVIS WITH CONTRAST TECHNIQUE: Multidetector CT imaging of the abdomen and pelvis was performed using the standard protocol following bolus administration of intravenous contrast. CONTRAST:  122mL OMNIPAQUE IOHEXOL 300 MG/ML  SOLN COMPARISON:  None. FINDINGS: Lower chest: Lung bases are clear. No effusions. Heart is normal size. Hepatobiliary: No focal hepatic abnormality. Gallbladder unremarkable. Pancreas: No focal abnormality or ductal dilatation. Spleen: No focal abnormality.  Normal size. Adrenals/Urinary Tract: No adrenal abnormality. No focal renal abnormality. No stones or hydronephrosis. Urinary bladder is unremarkable. Stomach/Bowel: Normal appendix. Vascular/Lymphatic: No evidence of aneurysm or adenopathy. Reproductive: Small enhancing subserosal nodules in the uterus,  compatible with small fibroids, the largest 12 mm. No adnexal mass. Other: Trace free fluid in the pelvis.  No free air. Musculoskeletal: No acute bony abnormality. IMPRESSION: No acute findings in the abdomen or pelvis. Electronically Signed   By: Rolm Baptise M.D.   On: 09/19/2018 10:01    Assessment & Plan:  Post op pain.  The pain is improved from her last visit.   Gradual increase in activities.   Reviewed results of CT  Total face-to-face time with patient was 20 min.  Greater than 50% was spent in counseling and coordination of care with the patient.   Jeffry Vogelsang L. Harraway-Smith, M.D., [place]

## 2018-10-13 ENCOUNTER — Ambulatory Visit: Payer: BLUE CROSS/BLUE SHIELD | Admitting: Obstetrics and Gynecology

## 2019-04-12 IMAGING — US US MFM FETAL BPP W/ NON-STRESS
1 series · 13 of 28 positions shown · non-contrast
Comparison: none

[Series 1: us mfm fetal bpp w/ non-stress · 71 acquisitions, 13 frames shown]
[im 3/71]
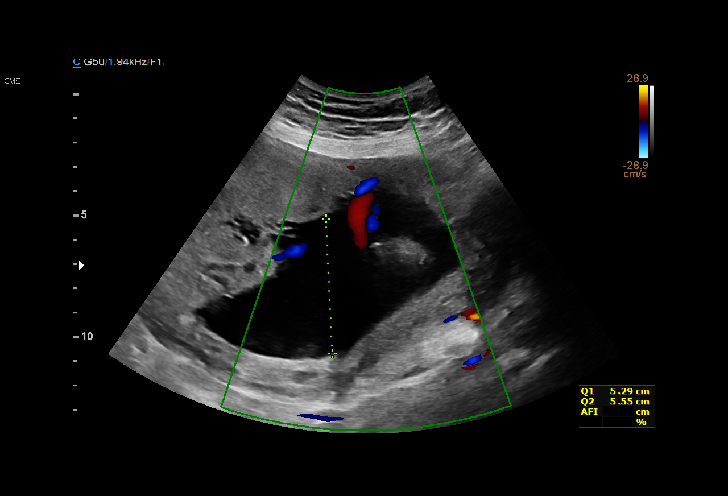
[im 8/71]
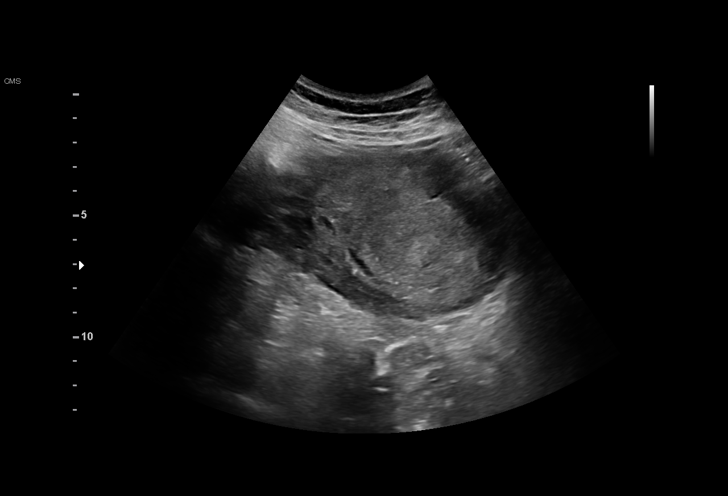
[im 13/71]
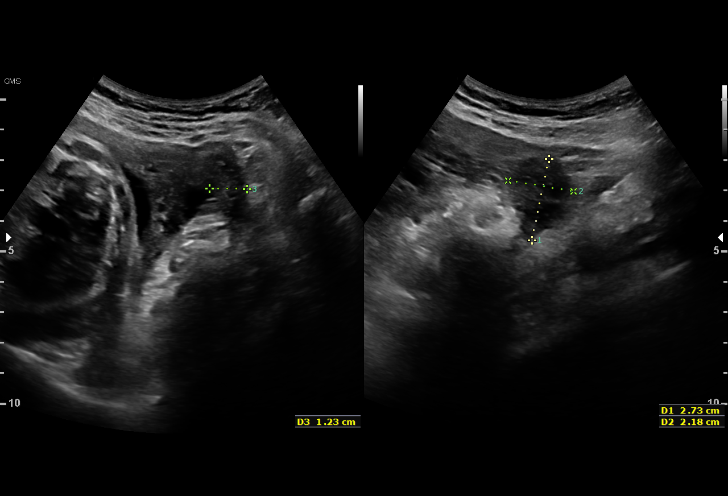
[im 19/71]
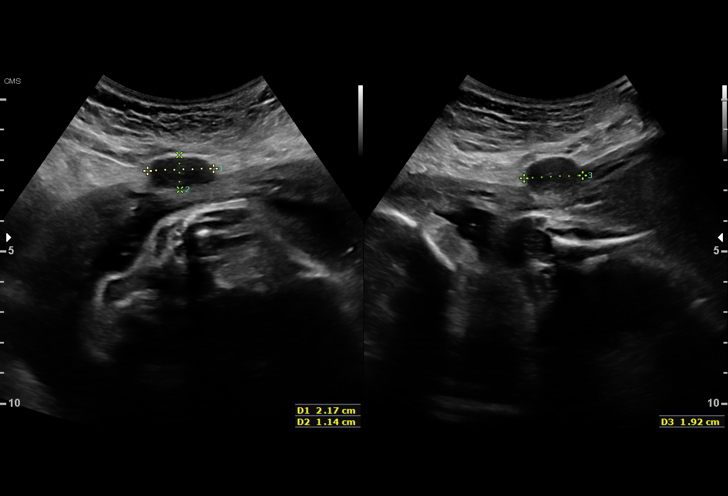
[im 24/71]
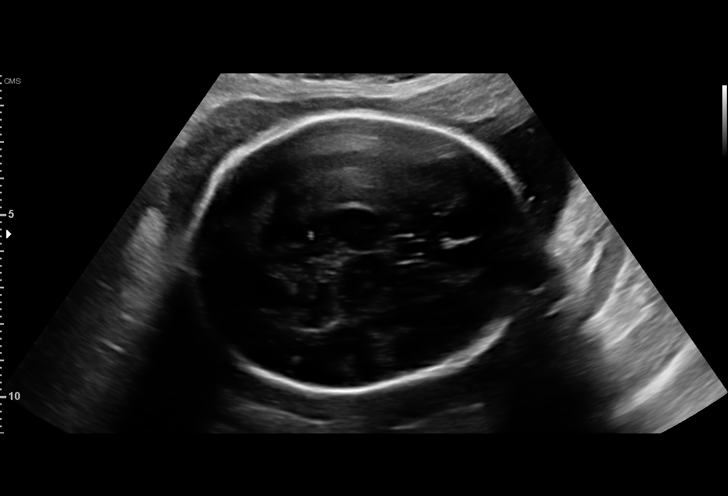
[im 29/71]
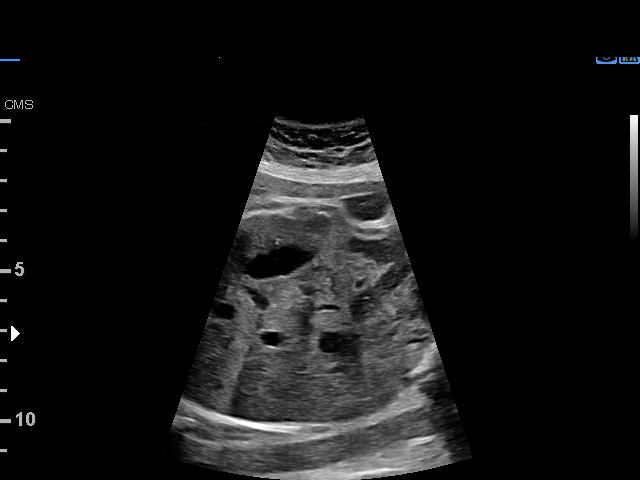
[im 37/71]
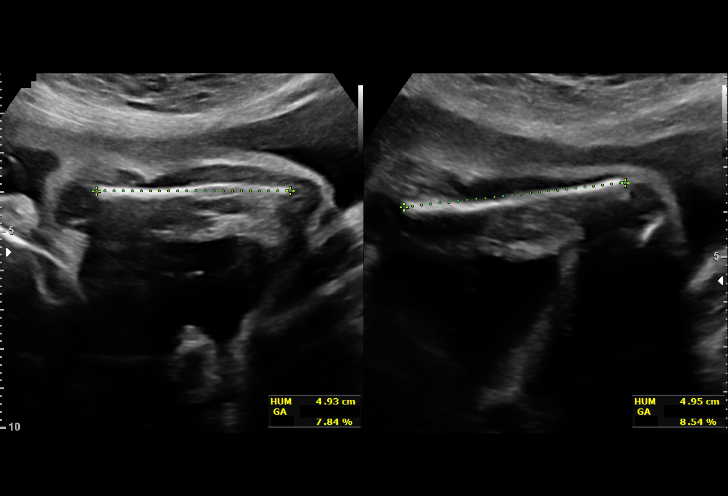
[im 42/71]
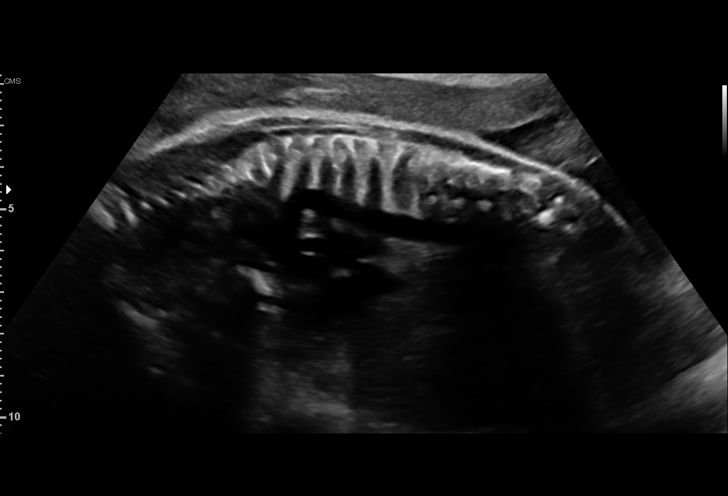
[im 47/71]
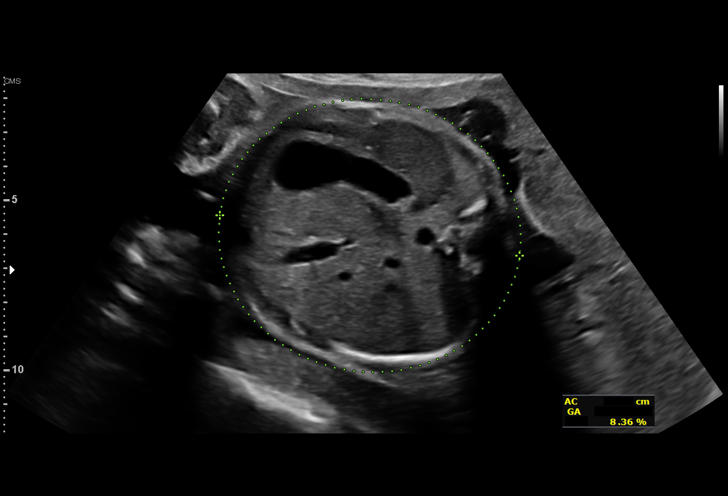
[im 52/71]
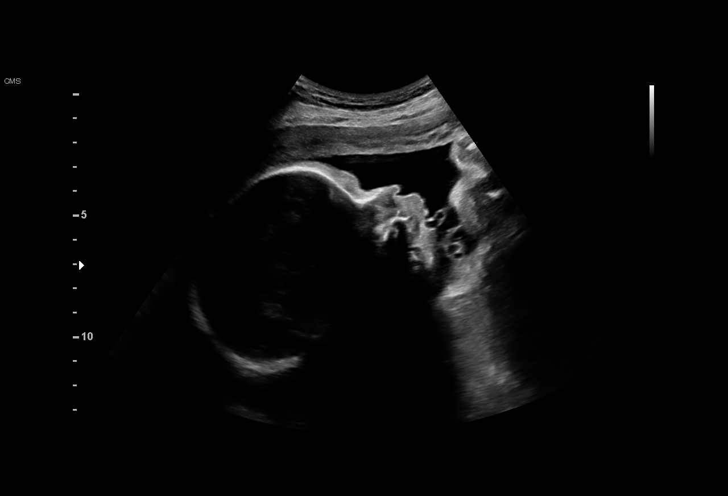
[im 58/71]
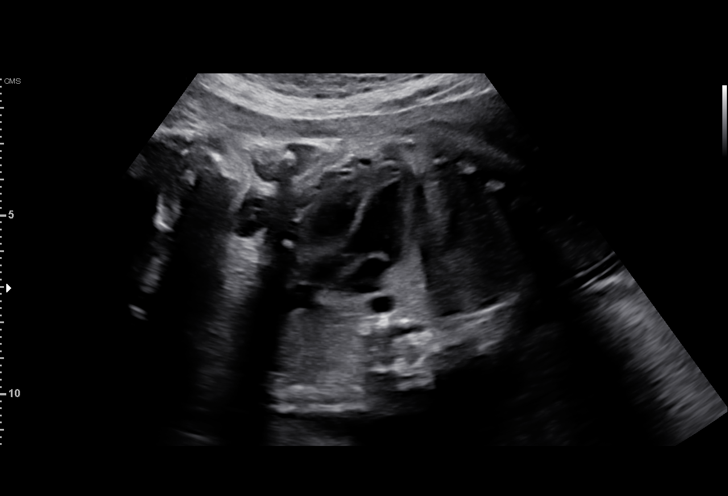
[im 63/71]
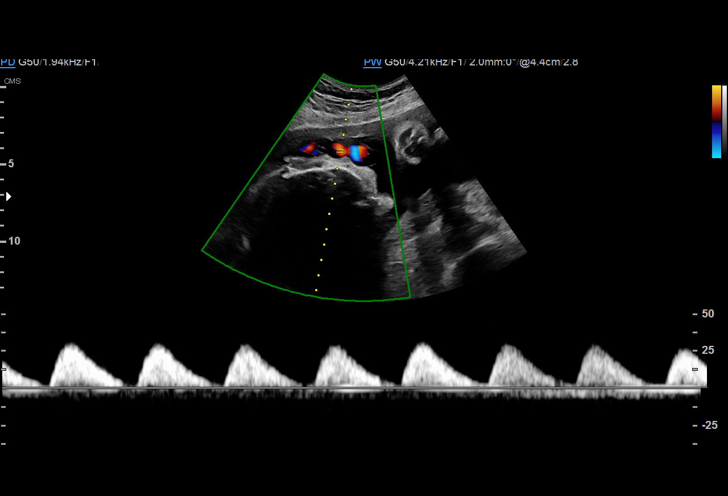
[im 68/71]
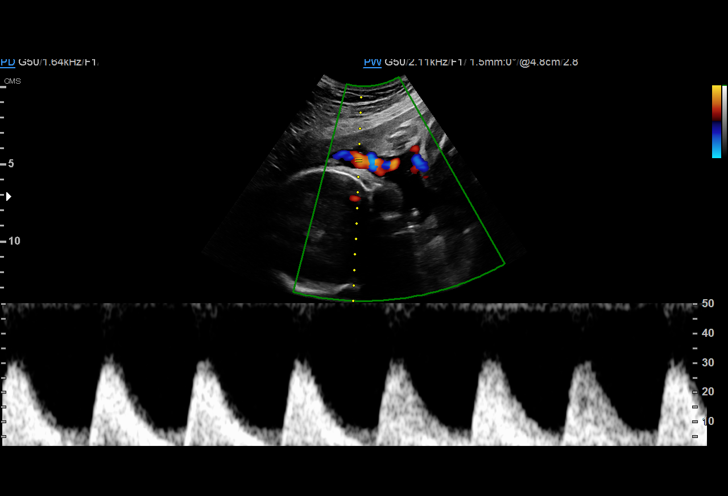

[13 of 28 positions shown; findings below may reference images not displayed]

W/NONSTRESS

Indications

Encounter for other antenatal screening
follow-up
Bicornuate uterus, 3RD trimester
32 weeks gestation of pregnancy
Family history of genetic disorder (FOB
parents are [place], both brothers are
mentally handicap)
Maternal care for known or suspected poor
fetal growth, third trimester, not applicable or
unspecified
Abnormal finding on antenatal screening
Vital Signs

Height:        5'2"
Fetal Evaluation

Num Of Fetuses:         1
Fetal Heart Rate(bpm):  137
Cardiac Activity:       Observed
Presentation:           Breech
Placenta:               Fundal
P. Cord Insertion:      Visualized

Amniotic Fluid
AFI FV:      Within normal limits

AFI Sum(cm)     %Tile       Largest Pocket(cm)
19.43           73
RUQ(cm)       RLQ(cm)       LUQ(cm)        LLQ(cm)
5.29
Biophysical Evaluation

Amniotic F.V:   Within normal limits       F. Tone:        Observed
F. Movement:    Observed                   Score:          [DATE]
F. Breathing:   Observed
Biometry

BPD:      75.4  mm     G. Age:  30w 2d          2  %    CI:        73.61   %    70 - 86
FL/HC:      18.7   %    19.9 -
HC:      279.2  mm     G. Age:  30w 4d        < 3  %    HC/AC:      1.07        0.96 -
AC:       261   mm     G. Age:  30w 1d          4  %    FL/BPD:     69.1   %    71 - 87
FL:       52.1  mm     G. Age:  27w 5d        < 3  %    FL/AC:      20.0   %    20 - 24
HUM:        49  mm     G. Age:  28w 6d        < 5  %

Est. FW:    0572  gm      3 lb 1 oz   < 10  %
OB History

Gravidity:    2         Term:   0         SAB:   1
Living:       0
Gestational Age

LMP:           33w 1d        Date:  10/20/17                 EDD:   07/27/18
U/S Today:     29w 5d                                        EDD:   08/20/18
Best:          32w 4d     Det. By:  Early Ultrasound         EDD:   07/31/18
(12/22/17)
Anatomy

Cranium:               Appears normal         Aortic Arch:            Appears normal
Cavum:                 Appears normal         Ductal Arch:            Appears normal
Ventricles:            Appears normal         Diaphragm:              Appears normal
Choroid Plexus:        Appears normal         Stomach:                Appears normal, left
sided
Cerebellum:            Appears normal         Abdomen:                Appears normal
Posterior Fossa:       Appears normal         Abdominal Wall:         Previously seen
Nuchal Fold:           Previously seen        Cord Vessels:           Appears normal (3
vessel cord)
Face:                  Appears normal         Kidneys:                Appear normal
(orbits and profile)
Lips:                  Previously seen        Bladder:                Appears normal
Thoracic:              Appears normal         Spine:                  Previously seen
Heart:                 Appears normal         Upper Extremities:      Previously seen
(4CH, axis, and situs
RVOT:                  Previously seen        Lower Extremities:      Previously seen
LVOT:                  Appears normal

Other:  Heels and 5th digit previously visualized. Technically difficult due to
fetal position.
Doppler - Fetal Vessels

Umbilical Artery
ADFV
Yes
Cervix Uterus Adnexa

Cervix
Not visualized (advanced GA >20wks)

Uterus
No abnormality visualized.

Left Ovary
Within normal limits.

Right Ovary
Within normal limits.

Cul De Sac
No free fluid seen.

Adnexa
No abnormality visualized.
Impression

Patient returned for fetal growth assessment. Her blood
pressures have been normal at prenatal visits. She does not
have gestational diabetes.

On ultrasound, the estimated fetal weight is at less than the
10th percentile. Amniotic fluid is normal and good fetal
activity is seen. Breech presentation. Antenatal testing is
reassuring. Umbilical artery Doppler showed intermittent
absent-end-diastolic flow. NST is reactive. BPP [DATE].

I explained the findings suggestive of fetal growth restriction. I
informed her that placental insufficiency is the most-likely
cause. I discussed Doppler findings and its progression that
will impact our decision on timing of delivery. If absent-end-
diastolic flow persists, we would recommend delivery at 34
weeks.

I counseled the patient on antenatal corticosteroids to
enhance fetal pulmonary maturity.
Recommendations

-BPP and Doppler on 06/13/18.
-Uly course to begin on her next visit.
-Continue twice-weekly antenatal testing.
-If absent-end-diastolic flow persists, we would recommend
delivery at 34 weeks.

## 2019-04-19 IMAGING — US US MFM UA CORD DOPPLER
1 series · 15 of 19 positions shown · non-contrast
Comparison: none

[Series 1: us mfm ua cord doppler · 19 acquisitions, 15 frames shown]
[im 1/19]
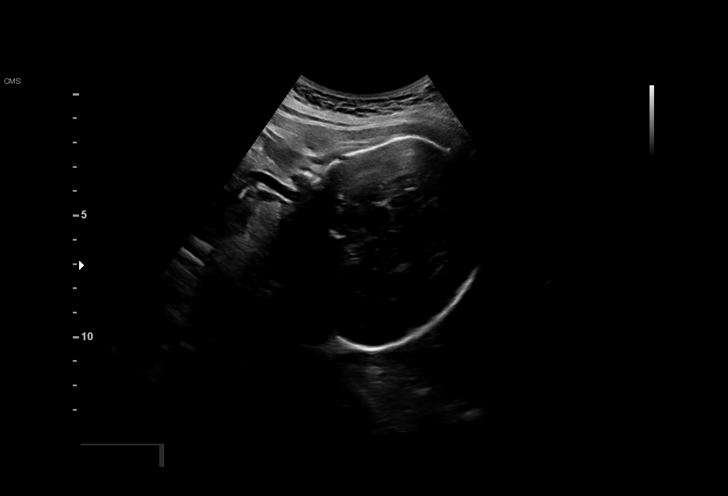
[im 2/19]
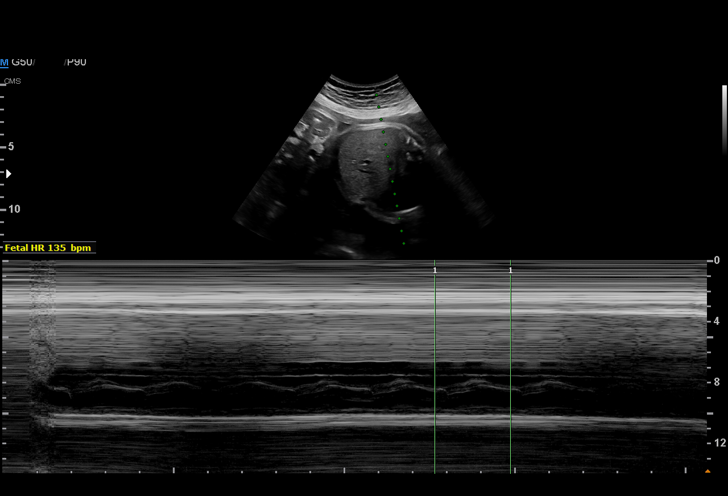
[im 4/19]
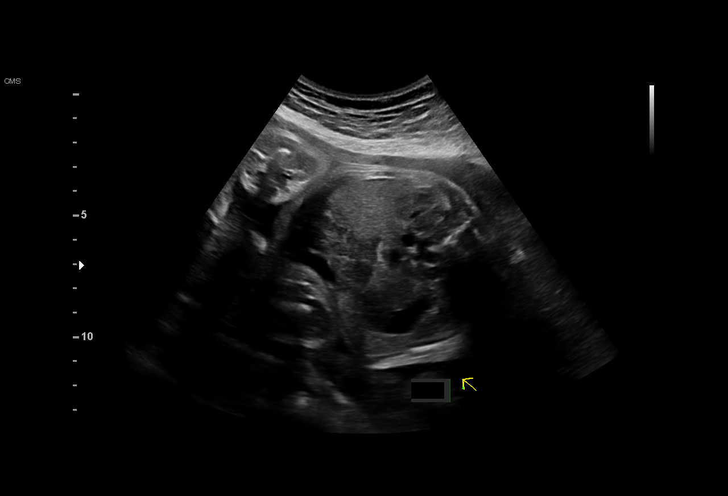
[im 5/19]
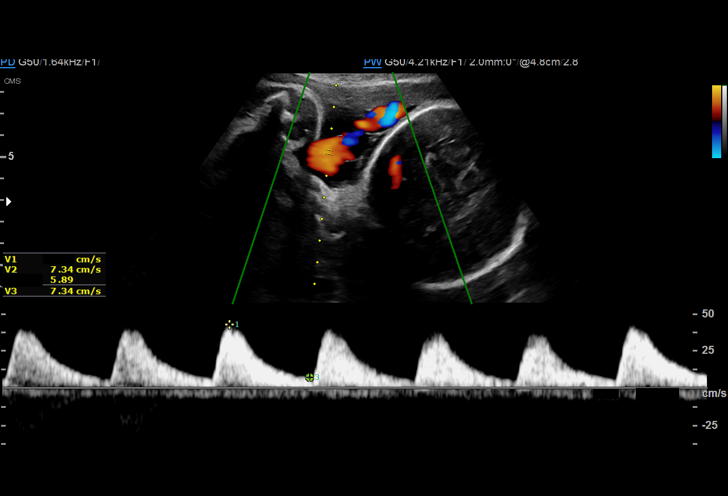
[im 6/19]
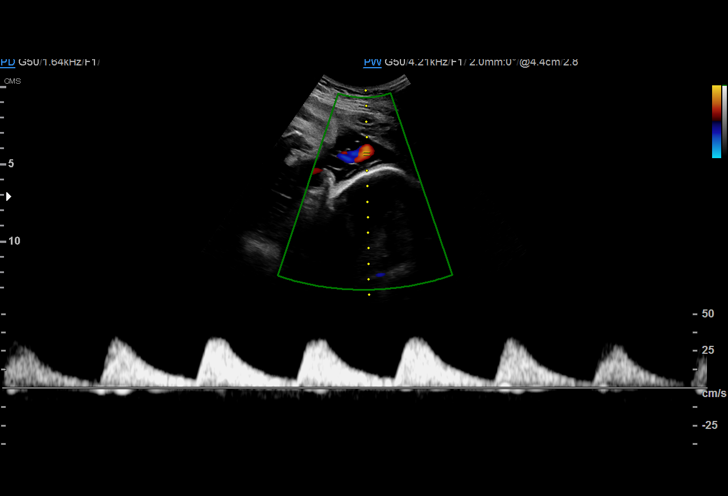
[im 7/19]
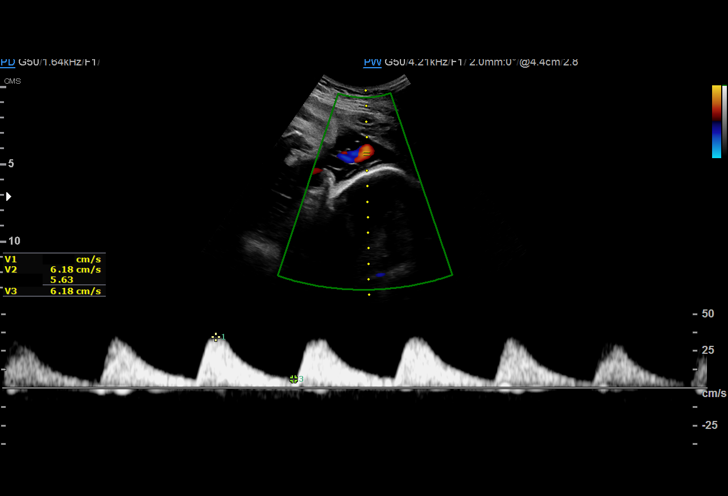
[im 9/19]
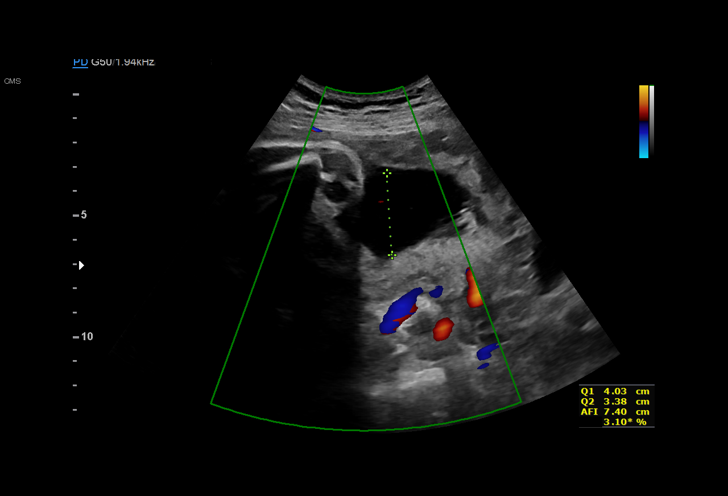
[im 10/19]
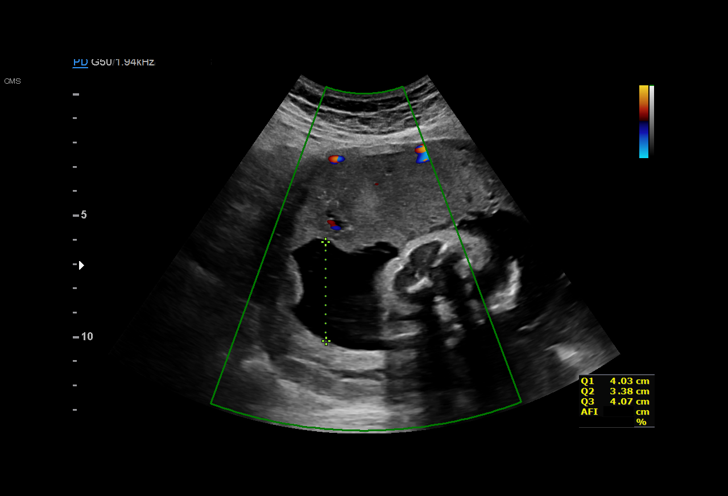
[im 11/19]
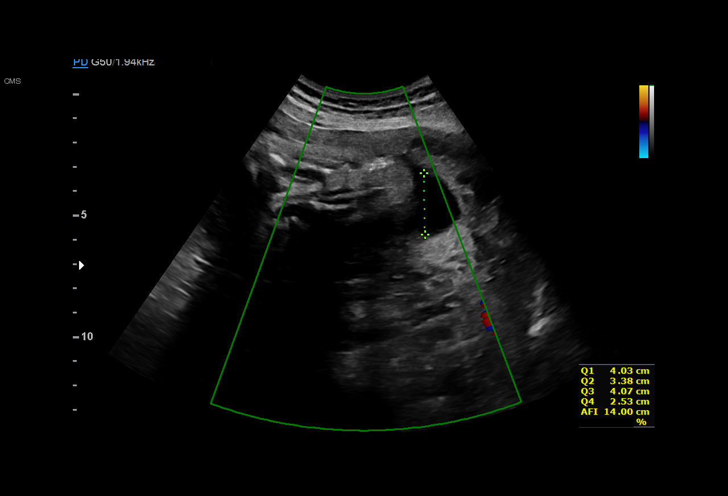
[im 13/19]
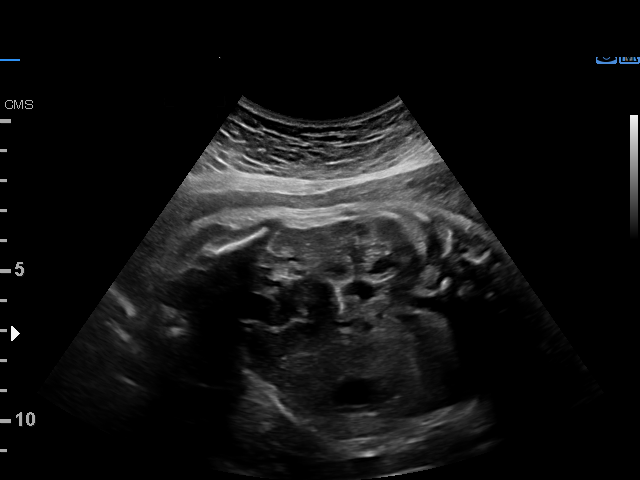
[im 14/19]
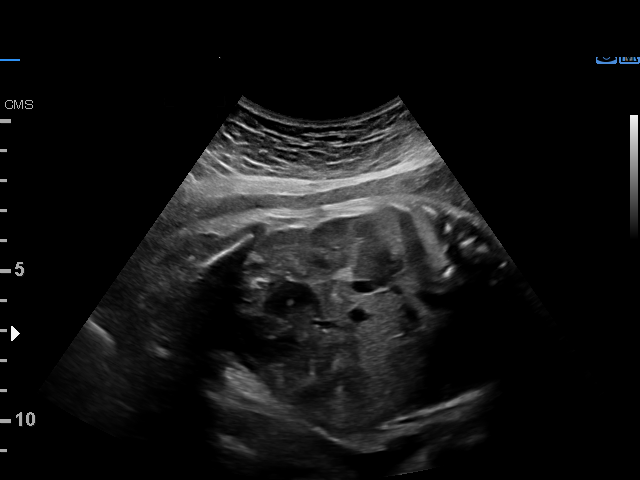
[im 15/19]
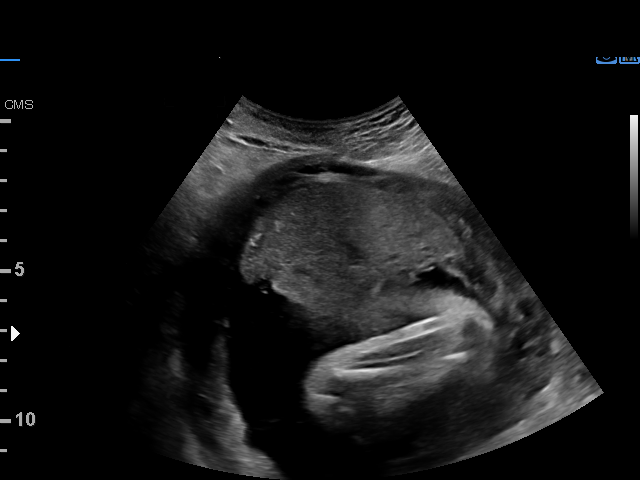
[im 16/19]
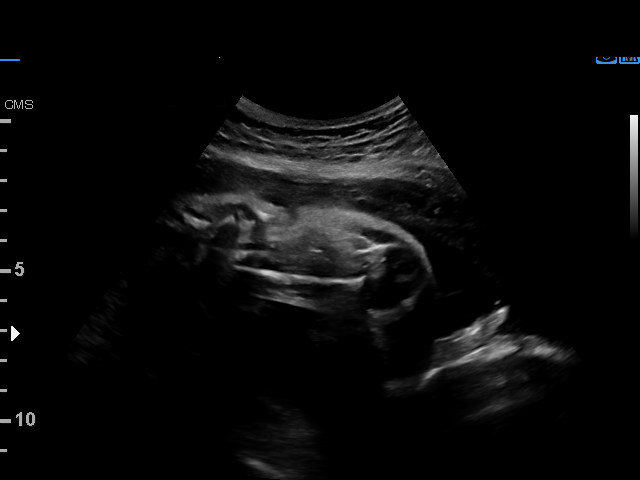
[im 18/19]
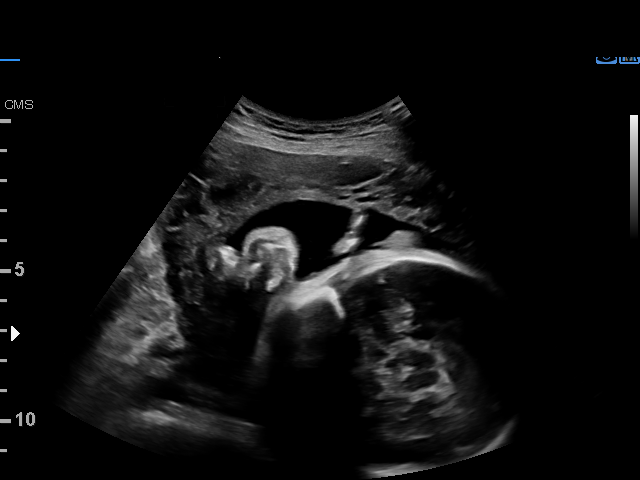
[im 19/19]
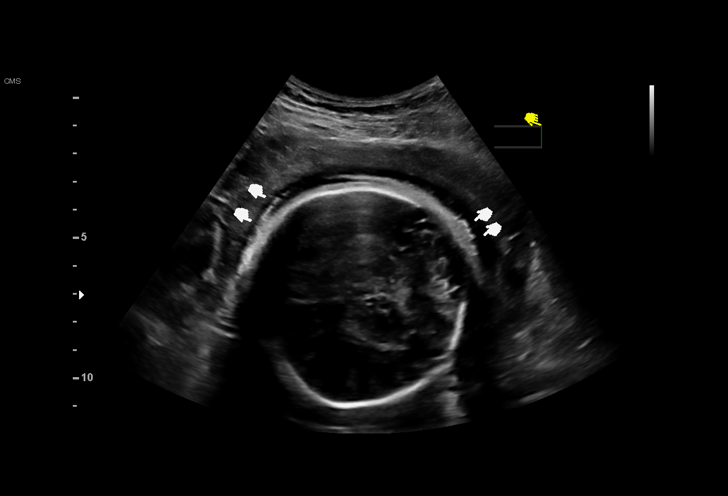

[15 of 19 positions shown; findings below may reference images not displayed]

W/NONSTRESS

Indications

Maternal care for known or suspected poor
fetal growth, third trimester, not applicable or
unspecified
Bicornuate uterus, 3RD trimester
Family history of genetic disorder (FOB
parents are [place], both brothers are
mentally handicap)
33 weeks gestation of pregnancy
Vital Signs

Height:        5'2"
Fetal Evaluation

Num Of Fetuses:         1
Fetal Heart Rate(bpm):  135
Cardiac Activity:       Observed
Presentation:           Cephalic

Amniotic Fluid
AFI FV:      Within normal limits

AFI Sum(cm)     %Tile       Largest Pocket(cm)
14.01           48

RUQ(cm)       RLQ(cm)       LUQ(cm)        LLQ(cm)
4.03
Biophysical Evaluation
Amniotic F.V:   Within normal limits       F. Tone:        Observed
F. Movement:    Observed                   N.S.T:          Reactive
F. Breathing:   Observed                   Score:          [DATE]
OB History

Gravidity:    2         Term:   0         SAB:   1
Living:       0
Gestational Age

LMP:           34w 1d        Date:  10/20/17                 EDD:   07/27/18
Best:          33w 4d     Det. By:  Early Ultrasound         EDD:   07/31/18
(12/22/17)
Doppler - Fetal Vessels

Umbilical Artery
S/D     %tile                                            ADFV    RDFV
5.63    > 97.5                                               No      No

Comment:    One 1 [DATE] to 2 minute deceleration noted.  IMOL[facility] on call
contacted.  Patient to go to [facility] for extended monitoring.
Comments

U/S images reviewed. Findings reviewed with patient.   No
fetal abnormalities are seen.  No evidence of fetal
compromise is found on BPP today.  UA dopplers are
elevated.
Questions answered.  1 [REDACTED]minute deceleration noted.
Discussed with IMOLA/DO on call
10 minutes spent face tto face with patient.
Recommendations: 1)To [facility] for extended monitoring 2)
Serial U/S every 2 weeks for fetal growth 3) Twice weekly
BPP/NST/UA dopplers  1 [REDACTED]minute deceleration noted.
Discussed with IMOLA/DO on call
Recommendations

1) Serial U/S every 2 weeks for fetal growth 2) Twice weekly
BPP/NST/UA dopplers 2) If AEDF, recommend delivery @ 34
weeks 3) To [facility] for extended monitoring

## 2019-10-11 ENCOUNTER — Encounter

## 2019-10-17 ENCOUNTER — Ambulatory Visit: Payer: BLUE CROSS/BLUE SHIELD | Admitting: Obstetrics & Gynecology

## 2019-10-17 ENCOUNTER — Encounter: Payer: Self-pay | Admitting: Obstetrics & Gynecology

## 2020-05-12 ENCOUNTER — Ambulatory Visit: Payer: BC Managed Care – PPO | Admitting: Nurse Practitioner

## 2020-05-20 ENCOUNTER — Ambulatory Visit: Payer: BC Managed Care – PPO | Admitting: Nurse Practitioner

## 2020-05-20 ENCOUNTER — Ambulatory Visit: Payer: BC Managed Care – PPO | Admitting: Student

## 2020-06-24 ENCOUNTER — Ambulatory Visit: Payer: BC Managed Care – PPO | Admitting: Certified Nurse Midwife

## 2020-10-11 DIAGNOSIS — U071 COVID-19: Secondary | ICD-10-CM

## 2020-10-11 HISTORY — DX: COVID-19: U07.1

## 2020-10-22 ENCOUNTER — Other Ambulatory Visit (HOSPITAL_COMMUNITY)
Admission: RE | Admit: 2020-10-22 | Discharge: 2020-10-22 | Disposition: A | Discharge status: Home / Self Care | Payer: BC Managed Care – PPO | Source: Ambulatory Visit | Attending: Obstetrics and Gynecology | Admitting: Obstetrics and Gynecology

## 2020-10-22 ENCOUNTER — Ambulatory Visit (INDEPENDENT_AMBULATORY_CARE_PROVIDER_SITE_OTHER): Payer: BC Managed Care – PPO | Admitting: Obstetrics and Gynecology

## 2020-10-22 ENCOUNTER — Encounter: Payer: Self-pay | Admitting: Obstetrics and Gynecology

## 2020-10-22 ENCOUNTER — Other Ambulatory Visit: Payer: Self-pay

## 2020-10-22 VITALS — BP 101/70 | HR 94 | Ht 61.0 in | Wt 128.1 lb

## 2020-10-22 DIAGNOSIS — N939 Abnormal uterine and vaginal bleeding, unspecified: Secondary | ICD-10-CM

## 2020-10-22 DIAGNOSIS — N940 Mittelschmerz: Secondary | ICD-10-CM | POA: Diagnosis not present

## 2020-10-22 MED ORDER — NAPROXEN 500 MG PO TABS: 500.0000 mg | ORAL_TABLET | Freq: Two times a day (BID) | ORAL | 2 refills | Status: AC

## 2020-10-22 NOTE — Progress Notes (Signed)
History of c-sectin and d&c  Obstetrics and Gynecology New Patient Evaluation  Appointment Date: 10/22/2020  OBGYN Clinic: [facility]  Primary Care Provider: Patient, No Pcp Per  Referring Provider: No ref. provider found  Chief Complaint: new AUB; mid cycle pain  History of Present Illness: Sara Pineda is a 35 y.o. [id number] (Patient's last menstrual period was 10/03/2020 (exact date).), seen for the above chief complaint.   Patient had LMP on 12/24 and her periods come on every 4 weeks but it just started today. It's dark and feels and has flow like a normal cycle. She had covid on 1/1.  For past 5-6 months she has mid cycle pain, no bleeding.   Review of Systems: Pertinent items are noted in HPI.    Patient Active Problem List   Diagnosis Date Noted  . Bleeding gums 04/11/2018  . Family history of genetic disorder   . 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 COVID-19 10/11/2020  . Hereditary disease in family possibly affecting pregnancy, antepartum 12/18/2015   FOB had 2 brothers with genetic anomalies, unsure of type FOB's parents are cousins  . IUGR (intrauterine growth restriction) affecting care of mother 06/15/2018  . Medical history non-contributory   . 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 CESAREAN SECTION N/A 06/20/2018   Procedure: CESAREAN SECTION;  Surgeon: Truett Mainland, DO;  Location: [facility];  Service: Obstetrics;  Laterality: N/A;  . DILATION AND EVACUATION N/A 01/06/2016   Procedure: DILATATION AND EVACUATION with Ultrasound Guidance;  Surgeon: Thurnell Lose, MD;  Location: [facility];  Service: Gynecology;  Laterality: N/A;    Past Obstetrical History:  OB History  Gravida Para Term Preterm AB Living  2 1 0 1 1 1   SAB IAB Ectopic Multiple Live Births  1       1    # Outcome Date  GA Lbr Len/2nd Weight Sex Delivery Anes PTL Lv  2 Preterm 06/20/18 [redacted]w[redacted]d   F CS-Unspec   LIV  1 SAB 12/2015            Past Gynecological History: As per HPI. Periods: qmonth, 5-6d, not heavy or painful  She is currently using no method for contraception.   Social History:  Social History   Socioeconomic History  . Marital status: Married    Spouse name: Not on file  . Number of children: Not on file  . Years of education: Not on file  . Highest education level: Not on file  Occupational History  . Not on file  Tobacco Use  . Smoking status: Never Smoker  . Smokeless tobacco: Never Used  Vaping Use  . Vaping Use: Never used  Substance and Sexual Activity  . Alcohol use: No  . Drug use: No  . Sexual activity: Yes    Birth control/protection: None  Other Topics Concern  . Not on file  Social History Narrative  . Not on file   Social Determinants of Health   Financial Resource Strain: Not on file  Food Insecurity: Not on file  Transportation Needs: Not on file  Physical Activity: Not on file  Stress: Not on file  Social Connections: Not on file  Intimate Partner Violence: Not on file    Family History: No family history on file.  Medications None  Allergies Patient has no known allergies.  Physical Exam:  BP 101/70   Pulse 94   Ht 5\' 1"  (1.549 m)   Wt 128 lb 1.6 oz (58.1 kg)   LMP 10/03/2020 (Exact Date)   BMI 24.20 kg/m  Body mass index is 24.2 kg/m. General appearance: Well nourished, well developed female in no acute distress.  Cardiovascular: normal s1 and s2.  No murmurs, rubs or gallops. Respiratory:  Normal respiratory effort Abdomen:  no masses, hernias; diffusely non tender to palpation, non distended Neuro/Psych:  Normal mood and affect.  Skin:  Warm and dry.  Lymphatic:  No inguinal lymphadenopathy.   Pelvic exam: is not limited by body habitus EGBUS: within normal limits Vagina: within normal limits and with no discharge in the  vault. Scant old blood in the vault Cervix: normal appearing cervix without tenderness, discharge or lesions. Uterus:  nonenlarged and non tender Adnexa:  normal adnexa and no mass, fullness, tenderness Rectovaginal: deferred  Laboratory: none  Radiology: none  Assessment: pt stable  Plan:  1. Abnormal uterine bleeding (AUB) I told her that leading ddx is due to recent covid, but I told her that research indicates any menstrual issues post covid should resolve on their own in the next few months with the vast majority after the first cycle post illness. Pt to monitor and let us know. Pap updated today - Cytology - PAP( [facility])  2. Mid cycle pain Not interested in any hormone/birth control. I told her it's normal and advised OTC naproxen for 48 hours when s/s start.   RTC PRN  Durene Romans MD Attending Center for [employer] [facility])

## 2020-10-22 NOTE — Patient Instructions (Signed)
Mittelschmerz Mittelschmerz is pain in the belly (abdomen) that affects women. The pain happens between periods. The pain:  Affects one side of the belly. The side may change from month to month.  May be mild or very bad.  May last from minutes to hours. It does not last longer than 1-2 days.  Can happen with a feeling of being sick to your stomach (nausea).  Can happen with a small amount of bleeding from your vagina. Mittelschmerz is caused by the growth and release of an egg from an ovary. It is a natural part of the ovulation cycle. It often happens about 2 weeks after a woman's period ends. Follow these instructions at home: Watch for any changes in your condition. Let your doctor know about them. Take these actions to help with your pain:  Try soaking in a hot bath.  Try a heating pad to relax the muscles in the belly.  Take over-the-counter and prescription medicines only as told by your doctor.  Keep all follow-up visits as told by your doctor. This is important.   Contact a doctor if:  The pain is very bad most months.  The pain lasts longer than 24 hours.  Your pain medicine is not helping.  You have a fever.  You feel sick to your stomach, and the feeling does not go away.  You keep throwing up (vomiting).  You miss your period.  You have more than a small amount of blood coming from your vagina between periods. Summary  Mittelschmerz is a condition that affects women. It is pain in the belly that happens between periods.  This condition is caused by the growth and release of an egg from an ovary.  The pain may affect one side of the belly, may be mild or very bad, or may cause you to feel sick to your stomach.  To help with your pain, soak in a hot bath, use a heating pad, or take medicines.  Watch for any changes in your condition. Know when to contact a doctor for help. This information is not intended to replace advice given to you by your health care  provider. Make sure you discuss any questions you have with your health care provider. Document Revised: 04/11/2018 Document Reviewed: 04/11/2018 Elsevier Patient Education  2021 [employer].

## 2020-10-23 LAB — CYTOLOGY - PAP
Chlamydia: NEGATIVE
Comment: NEGATIVE
Comment: NEGATIVE
Comment: NEGATIVE
Comment: NORMAL
Diagnosis: NEGATIVE
High risk HPV: NEGATIVE
Neisseria Gonorrhea: NEGATIVE
Trichomonas: NEGATIVE

## 2022-04-26 ENCOUNTER — Ambulatory Visit (INDEPENDENT_AMBULATORY_CARE_PROVIDER_SITE_OTHER): Payer: BC Managed Care – PPO | Admitting: Obstetrics and Gynecology

## 2022-04-26 ENCOUNTER — Other Ambulatory Visit (HOSPITAL_COMMUNITY)
Admission: RE | Admit: 2022-04-26 | Discharge: 2022-04-26 | Disposition: A | Discharge status: Home / Self Care | Payer: BC Managed Care – PPO | Source: Ambulatory Visit | Attending: Obstetrics and Gynecology | Admitting: Obstetrics and Gynecology

## 2022-04-26 ENCOUNTER — Encounter: Payer: Self-pay | Admitting: Obstetrics and Gynecology

## 2022-04-26 VITALS — BP 111/77 | HR 101 | Ht 61.0 in | Wt 117.7 lb

## 2022-04-26 DIAGNOSIS — N939 Abnormal uterine and vaginal bleeding, unspecified: Secondary | ICD-10-CM

## 2022-04-26 DIAGNOSIS — N926 Irregular menstruation, unspecified: Secondary | ICD-10-CM | POA: Diagnosis not present

## 2022-04-26 DIAGNOSIS — Q5128 Other doubling of uterus, other specified: Secondary | ICD-10-CM

## 2022-04-26 DIAGNOSIS — N941 Unspecified dyspareunia: Secondary | ICD-10-CM

## 2022-04-26 DIAGNOSIS — Z3202 Encounter for pregnancy test, result negative: Secondary | ICD-10-CM

## 2022-04-26 LAB — POCT PREGNANCY, URINE: Preg Test, Ur: NEGATIVE

## 2022-04-26 MED ORDER — MULTIVITAMINS PO CAPS: 1.0000 | ORAL_CAPSULE | Freq: Every day | ORAL | Status: AC

## 2022-04-26 NOTE — Progress Notes (Signed)
Pelvic US scheduled for 05/04/22 at 10 AM . Patient notified.

## 2022-04-26 NOTE — Progress Notes (Signed)
Pt reported pain in lower abdomen during Intercourse.

## 2022-04-26 NOTE — Progress Notes (Signed)
  Obstetrics and Gynecology Visit Return Patient Evaluation  Appointment Date: 04/26/2022  Primary Care Provider: Patient, No Pcp Per  OBGYN Clinic: [facility]  Chief Complaint: AUB, dyspareunia  History of Present Illness:  Sara Pineda is a 36 y.o. [id number] (LMP 6/10) with above CC. 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  AUB: patient saw her PCP on 5/12 b/c she had a normal menstrual cycle and then a 4 day one about two weeks after that. She had a normal period after that but she is currently about a week late. Periods are qmonth, regular, approximately 7 days  Dyspareunia: for the past 8 months or so, not every time, unsure of how frequently and she thinks it's with deeper penetration.   Late Period: about a week late. She has started spotting yesterday.   Review of Systems:  as noted in the History of Present Illness.  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 Current Outpatient Medications  Medication Sig Dispense Refill   naproxen (NAPROSYN) 500 MG tablet Take 1 tablet (500 mg total) by mouth 2 (two) times daily with a meal. For 2-3 days when you have ovulation pain (Patient not taking: Reported on 04/26/2022) 60 tablet 2   No current facility-administered medications for this visit.    Allergies: has No Known Allergies.  Physical Exam:  BP 111/77   Pulse (!) 101   Ht '5\' 1"'$  (1.549 m)   Wt 117 lb 11.2 oz (53.4 kg)   LMP 03/19/2022 (Approximate)   BMI 22.24 kg/m  Body mass index is 22.24 kg/m. General appearance: Well nourished, well developed female in no acute distress.  Abdomen: diffusely non tender to palpation, non distended, and no masses, hernias Neuro/Psych:  Normal mood and  affect.    Pelvic exam:  EGBUS: normal Vaginal vault: scant brown d/c in vault, no active bleeding Cervix:  nttp Bimanual: negative with 8week sized AV mobile uterus and negative adnexa   Labs: UPT neg  Assessment: pt stable  Plan:  1. Dyspareunia in female Pt states she noted the discomfort abdominally during the bimanual examPt amenable to u/s. Follow up swabs - Cervicovaginal ancillary only( [place])  2. Septate uterus  3. Late period Normal TSH late 2022. I expect period is about to start. I told her that sometimes periods can change closer to age 9 and as long as not bothersome then okay for expectant management  Pt okay if got pregnant but not actively trying. Recommend she start a multivitamin. D/w her that SAB and ability to conceive can be more difficult over age 43  4. Abnormal uterine bleeding (AUB) See above.    RTC: PRN  Durene Romans MD Attending [facility])

## 2022-04-27 LAB — CERVICOVAGINAL ANCILLARY ONLY
Bacterial Vaginitis (gardnerella): NEGATIVE
Candida Glabrata: NEGATIVE
Candida Vaginitis: NEGATIVE
Chlamydia: NEGATIVE
Comment: NEGATIVE
Comment: NEGATIVE
Comment: NEGATIVE
Comment: NEGATIVE
Comment: NEGATIVE
Comment: NORMAL
Neisseria Gonorrhea: NEGATIVE
Trichomonas: NEGATIVE

## 2022-05-04 ENCOUNTER — Ambulatory Visit
Admission: RE | Admit: 2022-05-04 | Discharge: 2022-05-04 | Disposition: A | Discharge status: Home / Self Care | Payer: BC Managed Care – PPO | Source: Ambulatory Visit | Attending: Obstetrics and Gynecology | Admitting: Obstetrics and Gynecology

## 2022-05-04 DIAGNOSIS — N941 Unspecified dyspareunia: Secondary | ICD-10-CM | POA: Insufficient documentation

## 2022-05-04 DIAGNOSIS — Q5128 Other doubling of uterus, other specified: Secondary | ICD-10-CM | POA: Insufficient documentation
